# Patient Record
Sex: Female | Born: 1945 | Race: White | Hispanic: No | Smoking: Former smoker
Health system: Southern US, Community
[De-identification: ages and names within clinical notes are randomized; demographics above are authoritative.]

## PROBLEM LIST (undated history)

## (undated) DIAGNOSIS — D649 Anemia, unspecified: Secondary | ICD-10-CM

## (undated) DIAGNOSIS — R112 Nausea with vomiting, unspecified: Secondary | ICD-10-CM

## (undated) DIAGNOSIS — IMO0001 Reserved for inherently not codable concepts without codable children: Secondary | ICD-10-CM

## (undated) DIAGNOSIS — Z9889 Other specified postprocedural states: Secondary | ICD-10-CM

## (undated) DIAGNOSIS — Z87891 Personal history of nicotine dependence: Secondary | ICD-10-CM

## (undated) DIAGNOSIS — Z87828 Personal history of other (healed) physical injury and trauma: Secondary | ICD-10-CM

## (undated) DIAGNOSIS — K5900 Constipation, unspecified: Secondary | ICD-10-CM

## (undated) DIAGNOSIS — I10 Essential (primary) hypertension: Secondary | ICD-10-CM

## (undated) DIAGNOSIS — B251 Cytomegaloviral hepatitis: Secondary | ICD-10-CM

## (undated) DIAGNOSIS — Z86718 Personal history of other venous thrombosis and embolism: Secondary | ICD-10-CM

## (undated) HISTORY — PX: APPENDECTOMY: SHX54

## (undated) HISTORY — DX: Morbid (severe) obesity due to excess calories: E66.01

## (undated) HISTORY — DX: Personal history of nicotine dependence: Z87.891

## (undated) HISTORY — DX: Essential (primary) hypertension: I10

## (undated) HISTORY — PX: TONSILLECTOMY: SUR1361

## (undated) HISTORY — PX: CHOLECYSTECTOMY: SHX55

## (undated) HISTORY — PX: MOUTH SURGERY: SHX715

---

## 1997-11-15 ENCOUNTER — Ambulatory Visit (HOSPITAL_COMMUNITY): Admission: RE | Admit: 1997-11-15 | Discharge: 1997-11-15 | Payer: Self-pay | Admitting: Obstetrics and Gynecology

## 1998-12-25 ENCOUNTER — Encounter: Payer: Self-pay | Admitting: Family Medicine

## 1998-12-25 ENCOUNTER — Encounter: Admission: RE | Admit: 1998-12-25 | Discharge: 1998-12-25 | Payer: Self-pay | Admitting: Family Medicine

## 1999-01-04 ENCOUNTER — Encounter: Payer: Self-pay | Admitting: Family Medicine

## 1999-01-04 ENCOUNTER — Encounter: Admission: RE | Admit: 1999-01-04 | Discharge: 1999-01-04 | Payer: Self-pay | Admitting: Family Medicine

## 2000-03-13 ENCOUNTER — Encounter: Admission: RE | Admit: 2000-03-13 | Discharge: 2000-03-13 | Payer: Self-pay | Admitting: Obstetrics and Gynecology

## 2000-03-13 ENCOUNTER — Encounter: Payer: Self-pay | Admitting: Obstetrics and Gynecology

## 2001-07-21 ENCOUNTER — Encounter: Admission: RE | Admit: 2001-07-21 | Discharge: 2001-07-21 | Payer: Self-pay | Admitting: Internal Medicine

## 2001-07-21 ENCOUNTER — Encounter: Payer: Self-pay | Admitting: Internal Medicine

## 2001-09-23 ENCOUNTER — Ambulatory Visit (HOSPITAL_COMMUNITY): Admission: RE | Admit: 2001-09-23 | Discharge: 2001-09-23 | Payer: Self-pay | Admitting: Gastroenterology

## 2002-03-03 DIAGNOSIS — Z86718 Personal history of other venous thrombosis and embolism: Secondary | ICD-10-CM

## 2002-03-03 HISTORY — DX: Personal history of other venous thrombosis and embolism: Z86.718

## 2002-08-03 ENCOUNTER — Encounter: Payer: Self-pay | Admitting: Urology

## 2002-08-03 ENCOUNTER — Inpatient Hospital Stay (HOSPITAL_COMMUNITY): Admission: EM | Admit: 2002-08-03 | Discharge: 2002-08-05 | Payer: Self-pay | Admitting: Emergency Medicine

## 2002-09-07 ENCOUNTER — Encounter: Payer: Self-pay | Admitting: Internal Medicine

## 2002-09-07 ENCOUNTER — Inpatient Hospital Stay (HOSPITAL_COMMUNITY): Admission: AD | Admit: 2002-09-07 | Discharge: 2002-09-12 | Payer: Self-pay | Admitting: Internal Medicine

## 2002-09-08 ENCOUNTER — Encounter (INDEPENDENT_AMBULATORY_CARE_PROVIDER_SITE_OTHER): Payer: Self-pay | Admitting: Internal Medicine

## 2004-04-15 ENCOUNTER — Other Ambulatory Visit: Admission: RE | Admit: 2004-04-15 | Discharge: 2004-04-15 | Payer: Self-pay | Admitting: Obstetrics and Gynecology

## 2004-05-06 ENCOUNTER — Encounter: Admission: RE | Admit: 2004-05-06 | Discharge: 2004-05-06 | Payer: Self-pay | Admitting: Obstetrics and Gynecology

## 2005-05-19 ENCOUNTER — Encounter: Admission: RE | Admit: 2005-05-19 | Discharge: 2005-05-19 | Payer: Self-pay | Admitting: Family Medicine

## 2005-08-11 ENCOUNTER — Ambulatory Visit: Payer: Self-pay | Admitting: Family Medicine

## 2005-09-29 ENCOUNTER — Ambulatory Visit: Payer: Self-pay | Admitting: Family Medicine

## 2005-12-30 ENCOUNTER — Ambulatory Visit: Payer: Self-pay | Admitting: Family Medicine

## 2005-12-31 ENCOUNTER — Encounter: Admission: RE | Admit: 2005-12-31 | Discharge: 2005-12-31 | Payer: Self-pay | Admitting: Family Medicine

## 2006-04-07 ENCOUNTER — Ambulatory Visit: Payer: Self-pay | Admitting: Family Medicine

## 2006-04-09 ENCOUNTER — Encounter: Admission: RE | Admit: 2006-04-09 | Discharge: 2006-04-09 | Payer: Self-pay | Admitting: Family Medicine

## 2006-07-23 ENCOUNTER — Other Ambulatory Visit: Admission: RE | Admit: 2006-07-23 | Discharge: 2006-07-23 | Payer: Self-pay | Admitting: Obstetrics and Gynecology

## 2006-09-17 ENCOUNTER — Inpatient Hospital Stay (HOSPITAL_COMMUNITY): Admission: RE | Admit: 2006-09-17 | Discharge: 2006-09-20 | Payer: Self-pay | Admitting: Obstetrics and Gynecology

## 2006-09-17 ENCOUNTER — Encounter (INDEPENDENT_AMBULATORY_CARE_PROVIDER_SITE_OTHER): Payer: Self-pay | Admitting: Obstetrics and Gynecology

## 2006-09-17 HISTORY — PX: ABDOMINAL HYSTERECTOMY: SHX81

## 2006-09-24 ENCOUNTER — Inpatient Hospital Stay (HOSPITAL_COMMUNITY): Admission: AD | Admit: 2006-09-24 | Discharge: 2006-09-26 | Payer: Self-pay | Admitting: Obstetrics and Gynecology

## 2006-09-24 ENCOUNTER — Encounter: Admission: RE | Admit: 2006-09-24 | Discharge: 2006-09-24 | Payer: Self-pay | Admitting: Obstetrics and Gynecology

## 2006-11-09 ENCOUNTER — Ambulatory Visit: Payer: Self-pay | Admitting: Family Medicine

## 2006-12-11 ENCOUNTER — Ambulatory Visit: Payer: Self-pay | Admitting: Family Medicine

## 2006-12-31 ENCOUNTER — Ambulatory Visit: Payer: Self-pay | Admitting: Family Medicine

## 2007-03-08 ENCOUNTER — Ambulatory Visit: Payer: Self-pay | Admitting: Family Medicine

## 2007-06-30 ENCOUNTER — Encounter: Admission: RE | Admit: 2007-06-30 | Discharge: 2007-06-30 | Payer: Self-pay | Admitting: Family Medicine

## 2007-09-10 ENCOUNTER — Ambulatory Visit: Payer: Self-pay | Admitting: Family Medicine

## 2008-04-22 ENCOUNTER — Emergency Department (HOSPITAL_COMMUNITY): Admission: EM | Admit: 2008-04-22 | Discharge: 2008-04-23 | Payer: Self-pay | Admitting: Emergency Medicine

## 2008-06-07 ENCOUNTER — Ambulatory Visit: Payer: Self-pay | Admitting: Family Medicine

## 2008-06-30 ENCOUNTER — Encounter: Admission: RE | Admit: 2008-06-30 | Discharge: 2008-06-30 | Payer: Self-pay | Admitting: Family Medicine

## 2008-09-12 ENCOUNTER — Ambulatory Visit: Payer: Self-pay | Admitting: Family Medicine

## 2008-09-22 ENCOUNTER — Encounter: Admission: RE | Admit: 2008-09-22 | Discharge: 2008-09-22 | Payer: Self-pay | Admitting: Family Medicine

## 2009-02-15 ENCOUNTER — Ambulatory Visit: Payer: Self-pay | Admitting: Family Medicine

## 2009-03-05 ENCOUNTER — Ambulatory Visit: Payer: Self-pay | Admitting: Family Medicine

## 2009-04-05 ENCOUNTER — Ambulatory Visit: Payer: Self-pay | Admitting: Family Medicine

## 2009-07-03 ENCOUNTER — Encounter: Admission: RE | Admit: 2009-07-03 | Discharge: 2009-07-03 | Payer: Self-pay | Admitting: Family Medicine

## 2010-01-01 ENCOUNTER — Ambulatory Visit: Payer: Self-pay | Admitting: Family Medicine

## 2010-03-07 ENCOUNTER — Ambulatory Visit
Admission: RE | Admit: 2010-03-07 | Discharge: 2010-03-07 | Payer: Self-pay | Source: Home / Self Care | Attending: Family Medicine | Admitting: Family Medicine

## 2010-03-19 ENCOUNTER — Ambulatory Visit
Admission: RE | Admit: 2010-03-19 | Discharge: 2010-03-19 | Payer: Self-pay | Source: Home / Self Care | Attending: Family Medicine | Admitting: Family Medicine

## 2010-06-10 ENCOUNTER — Other Ambulatory Visit: Payer: Self-pay | Admitting: Family Medicine

## 2010-06-10 DIAGNOSIS — Z1231 Encounter for screening mammogram for malignant neoplasm of breast: Secondary | ICD-10-CM

## 2010-07-05 ENCOUNTER — Ambulatory Visit
Admission: RE | Admit: 2010-07-05 | Discharge: 2010-07-05 | Disposition: A | Discharge status: Home / Self Care | Payer: BC Managed Care – PPO | Source: Ambulatory Visit | Attending: Family Medicine | Admitting: Family Medicine

## 2010-07-05 DIAGNOSIS — Z1231 Encounter for screening mammogram for malignant neoplasm of breast: Secondary | ICD-10-CM

## 2010-07-16 NOTE — H&P (Signed)
Marcia Paul, DURIO              ACCOUNT NO.:  [id number]   MEDICAL RECORD NO.:  [id number]          PATIENT TYPE:  INP   LOCATION:  9302                          FACILITY:  [facility]   PHYSICIAN:  Charles A. Delcambre, MDDATE OF BIRTH:  07-08-1945   DATE OF ADMISSION:  09/24/2006  DATE OF DISCHARGE:                              HISTORY & PHYSICAL   A 65 year old, para 1-0-0-2.  She is to be readmitted today with history  pertinent that she had a TAH-BSO last Thursday.  She is post-op day #7  today.  She has a history of a DVT, is on Lovenox and Coumadin waiting  for the PT INR to go out.  She did well Sunday night, ate solid food.  Early in the day on Monday, she ate solid food, then started to get  nauseated Monday night.  She became significantly nauseated Tuesday and  then very nauseated Wednesday, to the point that she could not take any  pain medicine.  She has some nasal drainage that she thinks is gagging  her in her throat but is clear to white and she tries to cough that up  and then will vomit as well.  She has continued to have bowel function,  solid stool earlier in the week with a suppository, then a loose stool,  then a looser stool, then today she has yellow liquid but she continues  passing this.  She had a flat and upright today which was normal.   PAST MEDICAL HISTORY:  1. Paratubal cyst.  2. PID, age 64.  3. Hepatitis C and V.  4. DVT was noted when she was in the hospital for a month with CMV.      Evidently, negative hypercoagulation workup done at that time.  5. Diabetes mellitus.  6. Hypertension.   MEDICATIONS:  Omega-3, vitamin C, magnesium, Janumet dose not specified,  Lipitor dose not specified, Benicar/hydrochlorothiazide dose not  specified.   ALLERGIES:  She has a reaction (itching, redness, and rash) to:  1. TAPE.  2. DARVON.   Staples have not been removed to this point.   SOCIAL HISTORY:  Social alcohol.  Discontinued cigarettes 2000.  Denies  tobacco, ethanol use, drug use, STD exposure in the past except for PID  at 24.  The patient is married in a monogamous relationship with her  husband.   FAMILY HISTORY:  Heart disease in her father.  COPD in her mother.  She  denies a family history of breast, uterus, ovaries, cervical, or colon  cancer, lymphoma, stroke, diabetes, hypertension.   REVIEW OF SYSTEMS:  Denies fever or chills, rashes, lesions, headaches,  dizziness.  She does have seasonal allergies.  No chest pain, shortness  of breath, wheezing.  She does have diarrhea at this time.  No  constipation, no bleeding per rectum, no melena.  She does not have  urgency, frequency, dysuria, incontinence, hematuria, galactorrhea, or  emotional changes.   PHYSICAL EXAMINATION:  GENERAL:  Alert and oriented x3, not feeling well  at all, has not taken any pain medicine in several days.  HEENT:  Grossly within  normal limits.  NECK:  Supple without thyromegaly or adenopathy.  BREASTS:  No masses, tenderness, discharge, skin, or nipple changes  bilaterally.  HEART:  Regular rate and rhythm without murmur, rub, or gallop.  ABDOMEN:  Soft, nontender, nondistended.  No hepatosplenomegaly or  masses noted.  No hernia.  Gross obesity is not noted limited  examination.  Staples are intact.  Incision is without erythema or  drainage.  PELVIC:  Deferred.  EXTREMITIES:  Nontender.  RECTAL:  Not done.  Anus perineal body normal.   LABORATORY:  Flat and upright are normal.  Glucose is 142, she is  fasting.  BUN 7, creatinine 0.7, sodium 136, potassium 3.5, chloride  102, CO2 25, calcium 9.5, total protein 6.8.  Albumin 3.4, total bili  0.6, alk phos 172, SGOT 91, SGPT 81.  Hemoglobin 13.2, hematocrit 39,  white blood cell count 9.3, 79 neutrophils, 13.9 lymphocytes, platelets  338.  Pro time 40.2, INR 8.94.   ASSESSMENT:  1. Resolving ileus.  2. Lack of pain medicine.  3. Intolerance of pain.  4. Nasal drainage compounding nausea and  vomiting.   PLAN:  1. Admit.  2. Hold Coumadin today.  Move to switch to a lower dose probably 5 mg      per day tomorrow and Saturday and then 7.5 mg Sunday and      thereafter.  3. We will repeat the PT INR in the morning.  4. IV lactated ringers 500 mL over 2 hours, then 125 cc/hr.  5. NPO except ice chips.  If she feels better she can have sips of      clears in the morning.  6. Dilaudid 3 mg load, 1-2 mg IV q.3 hours as noted, and the load is      IV as well, and the q.3 hours is p.r.n.  7. Reglan 10 mg q.8 h. p.r.n.  8. Zofran 4 mg IV q.4 h. p.r.n.  9. Phenergan 50 mg IV q.6 h. p.r.n.  10.Draw a hepatitis panel upon admission.  11.Stop Lovenox.      Charles A. Sydnee Cabal, MD  Electronically Signed     CAD/MEDQ  D:  09/24/2006  T:  09/24/2006  Job:  [id number]

## 2010-07-16 NOTE — Op Note (Signed)
Marcia Paul, Marcia Paul              ACCOUNT NO.:  [id number]   MEDICAL RECORD NO.:  [id number]          PATIENT TYPE:  INP   LOCATION:  9399                          [facility]ACILITY:  [facility]   PHYSICIAN:  Charles A. Delcambre, MDDATE OF BIRTH:  [birth year]   DATE OF PROCEDURE:  09/17/2006  DATE OF DISCHARGE:                               OPERATIVE REPORT   PREOPERATIVE DIAGNOSES:  1. Right ovarian mass enlarging.  2. History of a deep venous thrombosis.  3. Hypertension.  4. Diabetes.  5. Morbid obesity.   POSTOPERATIVE DIAGNOSES:  1. Benign right ovary cyst and ovary by frozen section.  2. History of a deep venous thrombosis.  3. Hypertension.  4. Diabetes.  5. Morbid obesity.   PROCEDURE:  Transabdominal hysterectomy and bilateral salpingo-  oophorectomy.   SURGEON:  Charles A. Sydnee Cabal, MD.   ASSISTANT:  Gerald Leitz, MD.   COMPLICATIONS:  None.   ESTIMATED BLOOD LOSS:  300 ml.   ANESTHESIA:  General.   SPECIMENS:  Uterus, tubes, and ovaries to Pathology.  Frozen section on  the right ovary was done with findings as noted above.   Instrument, sponge, and needle count correct x2.   DESCRIPTION OF PROCEDURE:  The patient was taken to the operating room,  placed in the supine position, and general anesthetic was induced  without difficulty.  She was then sterilely prepped and draped.  A  vertical skin incision was made with a knife, carried down to fascia  with the knife and with the Bovie cautery.  The fascia was entered  superiorly and cephalad, and the fascial incision was carried down  carefully to not enter the pannus to the symphysis pubis.  An extension  of the incision had to be made during the case to gain adequate exposure  around the umbilicus.  She did have subcu heparin for prophylaxis about  one hour before the case, and there was no excess bleeding during the  case.  Difficulty encountered was her morbid obesity, and in order to  see adequately, upper arm was  used on a Balfour retractor, the lower  bladder blade was used with a malleable arm, and ten moistened laps were  used to pack the bowel out of the pelvis totally.  Adequate  visualization was encountered at that time.  The cornual regions were  grasped with [facility] clamps.  Mild adhesions, but solid, were dissected  away from the ovary, freeing the ovary up off the bowel posteriorly and  down to the sidewall.  Blunt dissection was used to further free up the  ovary.  The cyst was not ruptured.  The round ligaments were transected  bilaterally and held with transfixion #0 Vicryl sutures.  The broad  ligament was opened bilaterally and the infundibulopelvic pedicle was  skeletonized.  The infundibulopelvic pedicles were then taken on either  side successively, free tied, and then transfixion stitched with #0  Vicryl.  The vesicouterine peritoneum was taken down across the anterior  surface of the lower uterine segment sharply with the Metzenbaum  scissors and then bluntly with the sponge forceps.  This allowed  visualization of the uterine vessels and after skeletonization these  vessels were taken in simple stitches bilaterally, successful bites of  the cardinal ligaments, and finally including the vaginal angle were  taken down on either side and all were transfixion stitched with #0  Vicryl.  The cervix was removed from the vagina, and the vaginal cuff  was closed with two Richardson angle sutures of #0 Vicryl and then a #0  Vicryl running locking suture.  Irrigation was carried out, and a small  amount of bleeding was encountered where the broad ligament was  dissected on the right.  Minor electrocautery was applied and Avitene  was placed in this area after pressure was held.  No further oozing was  noted, the Avitene was placed, and prior to that irrigation had been  carried out two times.  There was no evidence of injury to the bowel or  the bladder.  With good hemostasis established,  all sutures were removed  and the instruments were removed including the laps, with the media  count being correct, and the Balfour retractor.  The fascia was then  grasped with Kocher clamps bilaterally, a #1 PDS was used to close in a  running nonlocking stitch the fascia from above and then another stitch  from below, a #0 plain gut was placed in the subcutaneous tissue,  sterile skin staples were applied, and the patient tolerated the  procedure well.  A dressing was applied, and she was taken to recovery  with the physician in attendance.      Charles A. Sydnee Cabal, MD  Electronically Signed     CAD/MEDQ  D:  09/17/2006  T:  09/17/2006  Job:  [id number]

## 2010-07-16 NOTE — H&P (Signed)
Marcia Paul, Marcia Paul              ACCOUNT NO.:  [id number]   MEDICAL RECORD NO.:  [id number]          PATIENT TYPE:  AMB   LOCATION:                                [facility]  [facility]   PHYSICIAN:  Charles A. Delcambre, MDDATE OF BIRTH:  [birth year]   DATE OF ADMISSION:  09/17/2006  DATE OF DISCHARGE:                              HISTORY & PHYSICAL   HISTORY OF PRESENT ILLNESS:  The patient has a multiloculated cyst on  the right and a right paratubal cyst up to 4 cm.  CA 125 was 7.8.  Over  the last several years, the multiloculated cyst has been increasing in  size now up to 2.9 cm.  This has been slow growth over these years but  nonetheless, it is increasing in size in this menopausal woman.  She has  hemoglobin A1c recently of 6.2, fasting sugars that were on Janumet in  the 105- to 120-range.  It is occasionally 120s.  I feel this is likely  a benign cyst, but in the light of it being enlarged up to 4 cm on the  right with a multiloculated cyst as well adjacent and possibly together,  I would recommend surgery.  She gives informed consent, accepts the  risks of infection, bleeding, bowel and bladder damage, blood product  risk, hepatitis and HIV exposure, ureteral damage.  She understands that  she is at risk for a DVT in that she was hospitalized for CMV hepatitis  for one month and did have a DVT.  She was on Coumadin for six months  and presumably she states she had a hypercoagulable workup done at that  time.  This has been several years ago.   PAST MEDICAL HISTORY:  1. Hypertension.  2. Diabetes.  3. History of DVT after prolonged hospitalization for hepatitis.      Coumadin for 6 months.   SURGICAL HISTORY:  1. Tubal ligation.  2. D&C, hysteroscopy benign.   MEDICATIONS:  1. Janumet, dose not specified.  2. Lipitor, dose not specified.  3. Benicar/hydrochlorothiazide, dose not specified.   ALLERGIES:  NO KNOWN DRUG ALLERGIES.   SOCIAL HISTORY:  No tobacco, ethanol  or drug use.  She does have some  social alcohol use.  She did start smoking once again after seven years.  She smokes about a half pack per day at this time.  She is married in  aand in a monogamous relationship with her husband.   FAMILY HISTORY:  Heart disease in her father, COPD in her mother and  otherwise no major illnesses.  No ovarian cancer.   REVIEW OF SYSTEMS:  Denies fevers, chills, rashes, lesions, headaches,  dizziness, seasonal allergies, chest pain, shortness of breath,  wheezing, chronic cough, diarrhea, constipation, bleeding, melena,  hematochezia, urgency, frequency, dysuria, incontinence, hematuria,  galactorrhea or emotional changes.   PHYSICAL EXAMINATION:  GENERAL:  Alert and oriented x3.  VITAL SIGNS:  Blood pressure 124/80, heart rate 88, respirations 24,  weight, which is significant, 380 pounds.  HEENT:  Grossly within normal limits.  NECK:  Supple without thyromegaly or adenopathy.  LUNGS:  Clear bilaterally.  BACK:  No CVA, vertebral bodies nontender to palpation.  BREASTS:  No masses or tenderness, discharge, skin or nipple changes  bilaterally.  ABDOMEN:  Grossly obese.  GENITALIA:  Normal external female genitalia.  Bartholin's, urethral,  Skene's within normal limits.  Vault without discharge or lesions.  Multiparous cervix.  No cervical motion tenderness.  Uterus mildly  enlarged.  I could detect fullness in the right adnexa, mildly tender.  Left adnexa nontender.  I could not palpate the ovary.  EXTREMITIES:  Markedly obese in the thighs and calves.  Not edematous.   ASSESSMENT:  1. Enlarging pelvic mass, ovary on right side with multiloculated cyst      with normal CA 125 and a large paratubal cyst.  2. Hypertension.  3. Diabetes, well controlled with Janumet.  4. History of hepatitis C with a DVT (deep venous thrombosis).   PLAN:  Transabdominal hysterectomy and bilateral salpingo-oophorectomy.  Postoperatively, we will begin Lovenox the  evening of the surgery and  continue Lovenox until Coumadin brings the PT with INR 1.5 to 2.5 or  even 2 to 3 range.  She was in agreement and will follow up as directed.  Surgery is scheduled for September 17, 2006.  SCDs will be used around the  surgical time and she will remain n.p.o. past midnight.  She will take  her hypertension medicines and the Benicar/hydrochlorothiazide in the  morning.  Janumet she will withhold.  Lipitor she should take in the  evening.      Charles A. Sydnee Cabal, MD  Electronically Signed     CAD/MEDQ  D:  09/09/2006  T:  09/10/2006  Job:  [id number]

## 2010-07-16 NOTE — H&P (Signed)
Marcia Paul, Marcia Paul              ACCOUNT NO.:  [id number]   MEDICAL RECORD NO.:  [id number]          PATIENT TYPE:  AMB   LOCATION:  SDC                           [facility]   PHYSICIAN:  Charles A. Delcambre, MDDATE OF BIRTH:  Jan 06, 1946   DATE OF ADMISSION:  DATE OF DISCHARGE:                              HISTORY & PHYSICAL   Surgery scheduled for September 17, 2006.  She has an enlarging ovarian cyst  or mass, complex cyst, and CA-125 has been normal at 7.8, but as it  continues to enlarge we are going to go ahead and surgically get active  on this patient.  She will be admitted for transabdominal hysterectomy,  bilaterally salpingo-oophorectomy.  All questions are answered and she  accepts risks of infection, bleeding, bowel and bladder damage, blood  product risks including hepatitis and HIV exposure, remote risk of  cancer, ureteral damage, and incisional infection, DVT.  She is a 65-  year-old para 1-0-0-1.   PAST MEDICAL HISTORY:  1. Paratubal cyst.  2. PID age 7.  3. Hepatitis CMV.  4. DVT was noted when she was in the hospital for a month for CMV.      Evidently negative hypercoagulable workup done at that time.  5. Diabetes mellitus.  6. Hypertension.   MEDICATIONS:  Omega 3, vitamin C, magnesium, Janumet - dose not  specified, Lipitor - dose not specified, Benicar/hydrochlorothiazide -  dose not specified.   ALLERGIES:  She has a reaction to TAPE and DARVON - itching and redness  and rash.   SOCIAL HISTORY:  Social alcohol.  Discontinued cigarettes 2000.  Denies  tobacco or ethanol abuse, drug use or STD exposure in the past.  The  patient is married and in a monogamous relationship with her husband.   FAMILY HISTORY:  Heart disease in her father, COPD in her mother.  She  denies family history of breast, uterus, ovary, cervix or colon cancer,  lymphoma, stroke, diabetes or hypertension.   REVIEW OF SYSTEMS:  Denies fever, chills, rashes, lesions,  headaches,  dizziness, seasonal allergies, chest pain, shortness of breath,  wheezing, diarrhea, constipation, bleeding, melena, hematochezia,  urgency, frequency, dysuria, incontinence, hematuria, galactorrhea, or  emotional changes.   PHYSICAL EXAMINATION:  Alert and oriented x3.  No distress.  HEENT:  Grossly within normal limits.  NECK:  Supple without thyromegaly or adenopathy.  BREASTS:  No masses, tenderness, discharge, skin or nipple changes  bilaterally.  HEART:  Regular rate and rhythm without murmur, rub or gallop.  ABDOMEN:  Soft, nontender, nondistended.  No hepatosplenomegaly or other  masses noted.  No hernia.  Gross obesity is noted, limiting examination.  PELVIC:  Normal external female genitalia.  Bartholin's/urethral/Skene's  within normal limits.  Urethral meatus normal.  Urethra normal, bladder  normal, vagina normal.  Nulliparous-appearing cervix and menopausal in  appearance.  Vault without discharge or lesions.  No cervical motion  tenderness is present.  Pap was done which returned last Jul 23, 2006,  negative.  Bimanual examination is precluded secondary to gross obesity.  Exam difficult and nontender.  Uterus is not directly  palpable.  Adnexa  reached and nontender without masses bilaterally.  Limited by exam.  RECTAL:  Exam is not done secondary to discomfort with exam.  External  hemorrhoids are not present.  Anus and perineal body appear normal.   ASSESSMENT:  Pelvic mass, last on ultrasound seen 2.9 x 1.2 x 2.0 cm,  multiloculated, slowly increasing in size.  Right simple paratubal cyst  4.0 x 3.5 x 3.3 cm, increased in size from previous examination.  I  would recommend surgical exploration at this time secondary to the  multiloculated enlarging nature of the cyst on the ovary.  She gives  informed consent, with negative CA-125.  Will go ahead and proceed.  All  questions were answered.  She gives consent.  Accepts risks of  infection, bleeding, bowel  and bladder damage, blood product risks  including hepatitis and HIV exposure, ureteral damage, DVT risk and  incisional infection.  All questions were answered and will proceed as  outlined.  Postoperatively, we will go ahead and initiate Lovenox and  give Lovenox 6 hours after surgery 40 mg b.i.d., and then will go on and  get her onto the Coumadin starting postoperative day #1, and will  continue this for 3 months postoperatively.  SCDs will be used on the  thigh and legs.  Main limitation is the patient's body habitus and size  at 390, and will get a anesthesia preoperative.  Preoperative labs will  be CBC, PT, PTT, INR, type and screen, CMET, hepatic functional panel,  hemoglobin A1c, and EKG and chest x-ray.  All questions were answered  and we will proceed as outlined.      Charles A. Sydnee Cabal, MD  Electronically Signed     CAD/MEDQ  D:  09/10/2006  T:  09/11/2006  Job:  [id number]

## 2010-07-19 NOTE — Discharge Summary (Signed)
NAME:  Marcia Paul, Marcia Paul                        ACCOUNT NO.:  [id number]   MEDICAL RECORD NO.:  [id number]                   PATIENT TYPE:  INP   LOCATION:  6740                                 FACILITY:  [facility]   PHYSICIAN:  Thora Lance, M.D.               DATE OF BIRTH:  Sep 02, 1945   DATE OF ADMISSION:  09/07/2002  DATE OF DISCHARGE:  09/12/2002                                 DISCHARGE SUMMARY   REASON FOR ADMISSION:  This 65 year old white female was admitted to [facility] secondary to generalized weakness, myalgia, fever, headache,  rash, and some elevation of LFT's.  She was treated symptomatically and  discharged after two days.  CMV IgM serology came back elevated/positive.  Monostat was negative.  She had some atypical lymphocytes, and her blood  smear was felt to have CMV related mononucleosis syndrome with hepatitis.  Over the ensuing month, the patient admits significant improvement.  She had  persistent nausea, bilious vomiting, low-grade fever with generalized  weakness.  Her liver function tests had trended downward, but had not  normalized.  The patient was eating very little and taking only fluid.  An  acute hepatitis viral panel had been done and was negative.  Her right upper  quadrant ultrasound showed hepatomegaly and mild splenomegaly with diffuse  fatty changes throughout the liver.  HIV ELISA was reactive, but her Western  Blot test was indeterminate.   PHYSICAL EXAMINATION:  VITAL SIGNS:  Blood pressure 122/72, heart rate 80,  temperature 97.3.  ABDOMEN:  Soft.  There is mild tenderness in the upper quadrant.  Abdomen  without mass.  Hepatosplenomegaly is difficult to assess because of obesity.   ADMISSION LABORATORY DATA:  CBC:  WBC 6.7, hemoglobin 14.4, platelet count  278.  ESR was 19.  PT was 16.2, PTT 53.  Chemistries:  Sodium 133, potassium  3.1, chloride 98, bicarbonate 24, glucose 127, BUN 13, creatinine 1, calcium  9.9, total protein  7.1, albumin 3.6.  AST 123, ALT 64, alkaline phosphatase  100, total bilirubin 1.7, magnesium 1.7.  TSH 1.198.  Prealbumin 25.4.   HOSPITAL COURSE:  1. 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  The patient's liver function tests steadily improved.     By September 09, 2002, there was a marked improvement in the patient's clinical     status with less nausea.  She began eating some solid foods.  The patient     was seen by the infectious disease service.  They felt that the HIV     serology was likely a false-positive.  This was eventually confirmed with     CMV viral load testing proving negative.  The patient also had an     elevated PT and PTT.  She had a lupus anticoagulant test performed which     was negative.  Anticardiolipin antibodies, IgA, IgG, and IgM were all     negative.  On September 12, 2002, the patient had a venous ultrasound done as     she developed some left leg swelling, and this did show a calf DVT.  The     patient therefore was discharged on Lovenox and Coumadin.  The final     infectious disease impressions were CMV infection, clinically improved,     and false-positive HIV serology.   DISCHARGE DIAGNOSES:  1. Cytomegalovirus mononucleosis/hepatitis.  2. Left calf deep vein thrombosis.  3. Diabetes mellitus.  4. Hypertension.   DISCHARGE MEDICATIONS:  1. Prilosec OTC 20 mg daily.  2. Mucinex 600 mg one b.i.d.  3. Allegra p.r.n.  4. Nasonex p.r.n.  5. Lovenox 150 mg subcutaneously q.12h.  6. Coumadin 5 mg 1-1/2 q.p.m. x2 nights.   ACTIVITY:  As tolerated.   DIET:  Diabetic diet.   FOLLOWUP:  1. PT to be checked on September 14, 2002, at 8 a.m.  2. Follow up in 10 to 14 days with Dr. Kirby Funk.                                                Thora Lance, M.D.    Delorse Limber  D:  10/09/2002  T:  10/09/2002  Job:  [id number]

## 2010-07-19 NOTE — Discharge Summary (Signed)
Marcia Paul, Marcia Paul              ACCOUNT NO.:  [id number]   MEDICAL RECORD NO.:  [id number]          PATIENT TYPE:  INP   LOCATION:  9302                          FACILITY:  [facility]   PHYSICIAN:  Charles A. Delcambre, MDDATE OF BIRTH:  [birth year]   DATE OF ADMISSION:  09/24/2006  DATE OF DISCHARGE:  09/26/2006                               DISCHARGE SUMMARY   PRIMARY DISCHARGE DIAGNOSES:  1. Benign right ovarian cyst.  2. History of deep vein thrombosis.  3. Hypertension.  4. Diabetes.  5. Morbid obesity.  6. Postoperative ileus.   PROCEDURE:  Transabdominal hysterectomy and bilateral salpingo-  oophorectomy, management of anticoagulation.   HISTORY AND PHYSICAL:  Dictated and on the chart.   DISPOSITION:  The patient was discharged home to follow up in the office  in one week.  She was given precautions for no lifting greater than 25  pounds.  No driving a car for 2 weeks.  No long bath, shower okay for 2  weeks.  No intercourse for 4 weeks.  Notify with temperature greater  than 100 degrees, increased pain, or vaginal bleeding, or incisional  drainage or erythema.   She was given prescriptions for:  1. Lovenox 40 mg one subcutaneous b.i.d.  2. Coumadin 5 mg one p.o. daily.  Recheck PT INR in 3 days at the      office.  3. She was also given a prescription for Percocet 1-2 p.o. q.4 h.      p.r.n.  4. Instructed to restart the Benicar and Janumet in the morning, after      discharge.   CYTOLOGY:  Negative.   PATHOLOGY:  Cervix:  Unremarkable.  Endometrium:  Inactive.  Myometrium:  Adenomyosis and leiomyoma incidental.  Serosa:  Unremarkable.  Right  adnexa:  Serous cystadenofibroma with focal calcification benign.  Hydrosalpinx without evidence of malignancy and likely status post tubal  ligation.   HOSPITAL COURSE:  The patient was admitted, underwent Coumadin  regulation.  She was put NPO and bowel function slowly returned.  She  was advanced on a diet and discharged  home on September 26, 2006 with  followup as noted above.      Charles A. Sydnee Cabal, MD  Electronically Signed     CAD/MEDQ  D:  10/16/2006  T:  10/16/2006  Job:  [id number]

## 2010-07-19 NOTE — H&P (Signed)
NAME:  Marcia Paul, Marcia Paul                        ACCOUNT NO.:  [id number]   MEDICAL RECORD NO.:  [id number]                   PATIENT TYPE:  INP   LOCATION:  1829                                 FACILITY:  [facility]   PHYSICIAN:  Thora Lance, M.D.               DATE OF BIRTH:  1945-09-16   DATE OF ADMISSION:  08/03/2002  DATE OF DISCHARGE:                                HISTORY & PHYSICAL   CHIEF COMPLAINT:  Headache, weakness.   HISTORY OF PRESENT ILLNESS:  Liseth Wann is a 65 year old white female  patient of Dr. Kirby Funk who presents with a one-week history of periodic  weakness, body aches, headache, fever.  Husband states that she has not  checked her temperature at home but she has had chills and sweats,  especially in the last few days.  She has had some nausea but no vomiting  and has found it difficult to take in any p.o.  She has taken in some sips  of fluids in the last 24 hours.  In the last few days, she has been feeling  dizzy, like she was getting ready to faint.  She had a rash on her hands  last week that spread to her abdomen.  The hand rash had resolved but she  continues to have some areas on her abdomen.  In the office, she was found  to be extremely weak and hypotensive, as well as complaining of nausea.  She  is being admitted for the same.   REVIEW OF SYSTEMS:  NECK:  No neck stiffness.  CHEST:  No dyspnea, no cough,  no wheezing.  GI:  No pain but has had definite nausea without vomiting, as  above.  GU:  Denies any dysuria, hematuria.  No frequency.  PERIPHERAL:  No  edema.  INTEGUMENT:  A rash on hands last week spread to abdomen, now  resolving.   PAST MEDICAL HISTORY:  1. Hypertension.  2. Diet-controlled diabetes mellitus.  3. Allergic rhinitis.  4. Morbid obesity.  5. Depression.  6. Left ovarian cyst, followed by Dr. Floyde Parkins.   PAST SURGICAL HISTORY:  1. Appendectomy.  2. Tonsillectomy.  3. Cholecystectomy.  4. Tubal  ligation.  5. Dilatation and curettage.   ALLERGIES:  No true drug allergies.  She is intolerant to [facility] and an ACE  INHIBITOR.   SOCIAL HISTORY:  She is married, has one daughter age 41 and a grandchild  age 47.  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  She  has a graduate degree in accounting and business management.   FAMILY HISTORY:  Father died at age 57 of an MI.  Mother died at age 55s of  COPD.  She also had breast cancer.  She had a half-brother who died at age  69 from an MI.   MEDICATIONS:  1. Nettle leaf.  2. Diovan 40 mg a  day.  3. Omega-3 three-thousand mg a day.  4. __________  1200 mg a day.  5. Magnesium 750 mg a day.   PHYSICAL EXAMINATION:  VITAL SIGNS:  Her temperature is 99.2, pulse 78,  respirations 20.  Blood pressure lying 100/70, sitting 78 systolic.  GENERAL:  Very weak.  She is presyncopal when obtaining orthostatic blood  pressure measurements.  HEENT:  Ear, nose, and throat:  Unremarkable.  NECK:  No adenopathy.  HEART:  Regular rate and rhythm.  LUNGS:  Breath sounds are clear.  ABDOMEN:  Bowel sounds present in all four quadrants.  She is slightly  tender in the upper quadrants.  There is no obvious guarding or rebounding.  Exam is limited due to the patient's size.  GENITOURINARY:  Deferred.  EXTREMITIES:  No edema.  NEUROLOGIC:  No focal deficits seen.  INTEGUMENT:  Exam reveals a few maculopapular areas on her abdomen.  She has  no palmar rash.   IMPRESSION:  Weakness, hypotension.   PLAN:  Admit.  Obtain blood cultures x2, CBC, a CMET, RMSF, chest x-ray.  Start doxycycline and Rocephin after stat blood work obtained.                                               Thora Lance, M.D.    Delorse Limber  D:  08/03/2002  T:  08/03/2002  Job:  [id number]

## 2010-07-19 NOTE — Discharge Summary (Signed)
NAME:  Marcia Paul, Marcia Paul                        ACCOUNT NO.:  [id number]   MEDICAL RECORD NO.:  [id number]                   PATIENT TYPE:  INP   LOCATION:  5703                                 FACILITY:  [facility]   PHYSICIAN:  Thora Lance, M.D.               DATE OF BIRTH:  [birth year]   DATE OF ADMISSION:  08/03/2002  DATE OF DISCHARGE:  08/05/2002                                 DISCHARGE SUMMARY   REASON FOR ADMISSION:  A 65 year old white female who presented with one-  week history of periodic weakness, body aches, headache and  fever.  The  fever was subjective and low grade.  She had had some chills and sweats  periodically.  She has had nausea, but no vomiting.  In the days prior to  admission she had been feeling dizzy as if she was getting ready to faint.  She had a rash on her hands a week prior to admission which was resolving  and an abdominal rash which is also clearing.  In our office, she was found  to be very weak and hypotensive and was admitted for the same.   SIGNIFICANT FINDINGS:  VITAL SIGNS:  Temperature 99.2, heart rate 78,  respirations 20, blood pressure 100/70 lying and 70 sitting.  ABDOMEN:  Slightly tender in the upper quadrant.  No obvious rash.   LABORATORY DATA:  At admission, chemistries:  Sodium 134, potassium 3.4,  chloride 105, bicarbonate 24, glucose 145, BUN 12, creatinine 1.0, calcium  8.0, albumin 2.5, total protein 5.7, SGOT 228, SGPT 145, alk phos 88, total  bilirubin 0.7.  CBC on June 3:  WBC 3.7, hemoglobin 12.2, platelet count  159.   HOSPITAL COURSE:  The patient was admitted for a presumed infection with  symptoms of subjective fever, weakness, dizziness, myalgia and rash by her  report although this was never documented.  Blood cultures and urine culture  were taken and remain negative at the time of discharge.  [facility]  spotted fever titer was down and is pending.  Chest x-ray showed no  infiltrate.  Urinalysis was clear.   The patient was treated empirically with  Rocephin and doxycycline.  She was given IV fluids for dehydration.  By the  second hospital day, she was feeling much better with marked improvement in  all of her symptoms.  Her liver function tests were moderately abnormal with  an elevated SGOT and SGPT although her alk phos and total bilirubin remain  stable.  A mono spot and CVM, IgM were done and pending at discharge.  The  etiology of the elevated LFTs was felt to be probably secondary to a viral  syndrome versus possibly her hypotension.  At discharge, the patient was  taking p.o. well and ambulating.   DISCHARGE DIAGNOSES:  1. Viral syndrome.  2. Diabetes mellitus.   PROCEDURE:  None.   DISCHARGE MEDICATIONS:  1. Doxycycline 100  mg p.o. b.i.d. for five days.  2. Multivitamin.  3. Hold Diovan for now.   DIET:  No restrictions.    ACTIVITY:  As tolerated.   FOLLOW UP:  One week with Dr. Valentina Lucks.  Check liver function tests Monday in  my office.                                               Thora Lance, M.D.    Delorse Limber  D:  08/05/2002  T:  08/05/2002  Job:  [id number]
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
## 2010-07-22 ENCOUNTER — Telehealth: Payer: Self-pay | Admitting: Family Medicine

## 2010-07-22 NOTE — Telephone Encounter (Signed)
Faxed RX in chart

## 2010-08-05 ENCOUNTER — Other Ambulatory Visit: Payer: Self-pay

## 2010-08-05 MED ORDER — OLMESARTAN MEDOXOMIL-HCTZ 40-25 MG PO TABS: 1.0000 | ORAL_TABLET | Freq: Every day | ORAL | Status: DC

## 2010-11-08 ENCOUNTER — Encounter: Payer: Self-pay | Admitting: Family Medicine

## 2010-12-16 LAB — PROTIME-INR
INR: 1
INR: 1.2
INR: 5.5
Prothrombin Time: 12.8
Prothrombin Time: 53.8 — ABNORMAL HIGH

## 2010-12-16 LAB — CBC
HCT: 34.2 — ABNORMAL LOW
HCT: 43
Hemoglobin: 11.5 — ABNORMAL LOW
Hemoglobin: 14.1
MCHC: 32.9
MCHC: 33.6
MCV: 89.4
MCV: 89.8
RBC: 3.8 — ABNORMAL LOW
RDW: 14.5 — ABNORMAL HIGH

## 2010-12-16 LAB — COMPREHENSIVE METABOLIC PANEL
ALT: 21
AST: 42 — ABNORMAL HIGH
Albumin: 2.8 — ABNORMAL LOW
Alkaline Phosphatase: 83
BUN: 7
CO2: 26
CO2: 27
Chloride: 103
Chloride: 103
Creatinine, Ser: 0.73
Creatinine, Ser: 0.77
GFR calc non Af Amer: >60
GFR calc non Af Amer: >60
Glucose, Bld: 107 — ABNORMAL HIGH
Potassium: 5
Sodium: 135
Total Bilirubin: 0.6
Total Bilirubin: 1.2

## 2010-12-16 LAB — TYPE AND SCREEN
ABO/RH(D): O POS
Antibody Screen: POSITIVE
DAT, IgG: NEGATIVE

## 2010-12-16 LAB — HEPATITIS PANEL, ACUTE
Hep A IgM: NEGATIVE
Hep B C IgM: NEGATIVE

## 2010-12-16 LAB — APTT
aPTT: 30
aPTT: 62 — ABNORMAL HIGH

## 2010-12-18 ENCOUNTER — Other Ambulatory Visit: Payer: Self-pay | Admitting: Family Medicine

## 2010-12-18 MED ORDER — SITAGLIPTIN PHOS-METFORMIN HCL 50-500 MG PO TABS: 1.0000 | ORAL_TABLET | Freq: Two times a day (BID) | ORAL | Status: DC

## 2010-12-18 NOTE — Telephone Encounter (Signed)
Filled med with 1 refill gives pt enough time to make appt

## 2010-12-18 NOTE — Telephone Encounter (Signed)
Pt needs refill Janumet, will be scheduling appointment but going out of town tomorrow and needs refill

## 2011-01-22 ENCOUNTER — Ambulatory Visit (INDEPENDENT_AMBULATORY_CARE_PROVIDER_SITE_OTHER): Payer: BC Managed Care – PPO | Admitting: Family Medicine

## 2011-01-22 ENCOUNTER — Encounter: Payer: Self-pay | Admitting: Family Medicine

## 2011-01-22 DIAGNOSIS — Z0000 Encounter for general adult medical examination without abnormal findings: Secondary | ICD-10-CM

## 2011-01-22 DIAGNOSIS — E1169 Type 2 diabetes mellitus with other specified complication: Secondary | ICD-10-CM | POA: Insufficient documentation

## 2011-01-22 DIAGNOSIS — E1136 Type 2 diabetes mellitus with diabetic cataract: Secondary | ICD-10-CM | POA: Insufficient documentation

## 2011-01-22 DIAGNOSIS — Z23 Encounter for immunization: Secondary | ICD-10-CM

## 2011-01-22 DIAGNOSIS — E119 Type 2 diabetes mellitus without complications: Secondary | ICD-10-CM

## 2011-01-22 DIAGNOSIS — E785 Hyperlipidemia, unspecified: Secondary | ICD-10-CM

## 2011-01-22 DIAGNOSIS — R319 Hematuria, unspecified: Secondary | ICD-10-CM

## 2011-01-22 DIAGNOSIS — Z79899 Other long term (current) drug therapy: Secondary | ICD-10-CM

## 2011-01-22 DIAGNOSIS — Z2911 Encounter for prophylactic immunotherapy for respiratory syncytial virus (RSV): Secondary | ICD-10-CM

## 2011-01-22 DIAGNOSIS — H269 Unspecified cataract: Secondary | ICD-10-CM

## 2011-01-22 DIAGNOSIS — E118 Type 2 diabetes mellitus with unspecified complications: Secondary | ICD-10-CM | POA: Insufficient documentation

## 2011-01-22 DIAGNOSIS — N029 Recurrent and persistent hematuria with unspecified morphologic changes: Secondary | ICD-10-CM | POA: Insufficient documentation

## 2011-01-22 DIAGNOSIS — E1159 Type 2 diabetes mellitus with other circulatory complications: Secondary | ICD-10-CM | POA: Insufficient documentation

## 2011-01-22 DIAGNOSIS — F17200 Nicotine dependence, unspecified, uncomplicated: Secondary | ICD-10-CM | POA: Insufficient documentation

## 2011-01-22 DIAGNOSIS — I10 Essential (primary) hypertension: Secondary | ICD-10-CM

## 2011-01-22 LAB — CBC WITH DIFFERENTIAL/PLATELET
Basophils Absolute: 0 10*3/uL (ref 0.0–0.1)
Basophils Relative: 0 % (ref 0–1)
Eosinophils Relative: 2 % (ref 0–5)
HCT: 42.7 % (ref 36.0–46.0)
MCH: 28.4 pg (ref 26.0–34.0)
MCHC: 31.6 g/dL (ref 30.0–36.0)
MCV: 89.7 fL (ref 78.0–100.0)
Monocytes Absolute: 0.6 10*3/uL (ref 0.1–1.0)
RDW: 14.2 % (ref 11.5–15.5)

## 2011-01-22 LAB — COMPREHENSIVE METABOLIC PANEL
AST: 36 U/L (ref 0–37)
Alkaline Phosphatase: 93 U/L (ref 39–117)
BUN: 23 mg/dL (ref 6–23)
Calcium: 10.2 mg/dL (ref 8.4–10.5)
Creat: 0.87 mg/dL (ref 0.50–1.10)
Glucose, Bld: 133 mg/dL — ABNORMAL HIGH (ref 70–99)

## 2011-01-22 LAB — LIPID PANEL
HDL: 56 mg/dL (ref >39)
LDL Cholesterol: 56 mg/dL (ref 0–99)
Total CHOL/HDL Ratio: 2.3 Ratio
Triglycerides: 75 mg/dL (ref <150)

## 2011-01-22 LAB — HEMOGLOBIN A1C: Hgb A1c MFr Bld: 6.6 % — ABNORMAL HIGH (ref <5.7)

## 2011-01-22 LAB — POCT URINALYSIS DIPSTICK
Blood, UA: NEGATIVE
Glucose, UA: NEGATIVE
Ketones, UA: NEGATIVE
Spec Grav, UA: 1.015
Urobilinogen, UA: NEGATIVE

## 2011-01-22 NOTE — Progress Notes (Signed)
Subjective:    Patient ID: Marcia Paul, female    DOB: [birth year], 65 y.o.   MRN: [id number]  HPI She is here for a complete examination. She continues on medications listed in the chart. She has no particular concerns or complaints. She has been helping with various family members with her medical and social issues and apparently things are relatively calm in her life right now. She does not exercise regularly. She does continue to smoke although she has cut back. She gets an eye exam every 2 years. She does have cataracts and is being followed for this. She has a previous history of hematuria. Her work and home life are going well.   Review of Systems  Constitutional: Negative.   HENT: Negative.   Eyes: Negative.   Respiratory: Negative.   Cardiovascular: Negative.   Gastrointestinal: Negative.   Genitourinary: Negative.   Musculoskeletal: Negative.   Skin: Negative.   Neurological: Negative.   Hematological: Negative.   Psychiatric/Behavioral: Negative.        Objective:   Physical Exam BP 140/80  Pulse 74  Ht 5' 7.25" (1.708 m)  Wt 392 lb (177.81 kg)  BMI 60.94 kg/m2  General Appearance:    Alert, cooperative, no distress, appears stated age  Head:    Normocephalic, without obvious abnormality, atraumatic  Eyes:    PERRL, conjunctiva/corneas clear, EOM's intact, fundi    benign  Ears:    Normal TM's and external ear canals  Nose:   Nares normal, mucosa normal, no drainage or sinus   tenderness  Throat:   Lips, mucosa, and tongue normal; teeth and gums normal  Neck:   Supple, no lymphadenopathy;  thyroid:  no   enlargement/tenderness/nodules; no carotid   bruit or JVD  Back:    Spine nontender, no curvature, ROM normal, no CVA     tenderness  Lungs:     Clear to auscultation bilaterally without wheezes, rales or     ronchi; respirations unlabored  Chest Wall:    No tenderness or deformity   Heart:    Regular rate and rhythm, S1 and S2 normal, no murmur, rub   or  gallop  Breast Exam:    Deferred to GYN  Abdomen:     Soft, non-tender, nondistended, normoactive bowel sounds,    no masses, no hepatosplenomegaly  Genitalia:    Deferred to GYN     Extremities:   No clubbing, cyanosis , both lower extremities are quite edematous   Pulses:   2+ and symmetric all extremities  Skin:   Skin color, texture, turgor normal, no rashes or lesions  Lymph nodes:   Cervical, supraclavicular, and axillary nodes normal  Neurologic:   CNII-XII intact, normal strength, sensation and gait; reflexes 2+ and symmetric throughout          Psych:   Normal mood, affect, hygiene and grooming.           Assessment & Plan:   1. Diabetes mellitus  CBC with Differential, Comprehensive metabolic panel, Lipid panel, HgB A1c, POCT UA - Microalbumin  2. Hypertension associated with diabetes    3. Hyperlipidemia LDL goal <70  Lipid panel  4. Obesity, morbid (more than 100 lbs over ideal weight or BMI > 40)  CBC with Differential, Comprehensive metabolic panel, Lipid panel  5. Current smoker    6. Cataract    7. Benign hematuria    8. Encounter for long-term (current) use of other medications  CBC with Differential, Comprehensive metabolic  panel, Lipid panel  9. Physical exam, annual  POCT Urinalysis Dipstick   I discussed in detail making permanent lifestyle changes in regard to her diet and exercise as well as making sure she takes time to for herself. She recognizes the fact that she always puts her but he also is on, needs and desires above her own. She did seem to understand the whole concept and became quite tearful when we discuss this in more detail. I think she will indeed start to take better care of herself. Flu shot and a Zostavax shot given. Risks and benefits were discussed with her.

## 2011-02-06 ENCOUNTER — Other Ambulatory Visit: Payer: Self-pay | Admitting: Family Medicine

## 2011-02-18 ENCOUNTER — Other Ambulatory Visit: Payer: Self-pay | Admitting: Family Medicine

## 2011-02-18 MED ORDER — KETOCONAZOLE 2 % EX CREA: TOPICAL_CREAM | Freq: Every day | CUTANEOUS | Status: DC

## 2011-02-18 NOTE — Telephone Encounter (Signed)
Patient needs refill  Ketoconazole 2% cream (60grams) [facility] [address]

## 2011-02-23 ENCOUNTER — Other Ambulatory Visit: Payer: Self-pay | Admitting: Family Medicine

## 2011-03-25 ENCOUNTER — Other Ambulatory Visit: Payer: Self-pay | Admitting: Family Medicine

## 2011-04-27 ENCOUNTER — Other Ambulatory Visit: Payer: Self-pay | Admitting: Family Medicine

## 2011-05-16 ENCOUNTER — Encounter: Payer: Self-pay | Admitting: Internal Medicine

## 2011-05-22 ENCOUNTER — Ambulatory Visit (INDEPENDENT_AMBULATORY_CARE_PROVIDER_SITE_OTHER): Payer: BC Managed Care – PPO | Admitting: Family Medicine

## 2011-05-22 ENCOUNTER — Encounter: Payer: Self-pay | Admitting: Family Medicine

## 2011-05-22 VITALS — BP 140/92 | HR 68 | Ht 70.0 in | Wt 376.0 lb

## 2011-05-22 DIAGNOSIS — E785 Hyperlipidemia, unspecified: Secondary | ICD-10-CM

## 2011-05-22 DIAGNOSIS — E119 Type 2 diabetes mellitus without complications: Secondary | ICD-10-CM

## 2011-05-22 DIAGNOSIS — I10 Essential (primary) hypertension: Secondary | ICD-10-CM

## 2011-05-22 DIAGNOSIS — E1159 Type 2 diabetes mellitus with other circulatory complications: Secondary | ICD-10-CM

## 2011-05-22 DIAGNOSIS — E1169 Type 2 diabetes mellitus with other specified complication: Secondary | ICD-10-CM

## 2011-05-22 LAB — POCT GLYCOSYLATED HEMOGLOBIN (HGB A1C): Hemoglobin A1C: 6.5

## 2011-05-22 NOTE — Progress Notes (Signed)
  Subjective:    Patient ID: Marcia Paul, female    DOB: 03-30-1945, 66 y.o.   MRN: [id number]  HPI She is here for recheck. She did quit smoking since her last visit. She has had several episodes that were stressful however she has maintained her smoke free lifestyle. She walks 3 times per week and is slowly improving the amount of time that she is walking. She continues to make good strides in handling her normal life situation especially in regard to taking more control of her own life and not becoming overly involved in other peoples issues.   Review of Systems     Objective:   Physical Exam Alert and in no distress. Hemoglobin A1c 6.5       Assessment & Plan:   1. Type II or unspecified type diabetes mellitus without mention of complication, not stated as uncontrolled  POCT HgB A1C  2. Diabetes mellitus    3. Hypertension associated with diabetes    4. Hyperlipidemia LDL goal <70    5. Obesity, morbid (more than 100 lbs over ideal weight or BMI > 40)     35 minutes spent discussing her overall care mainly with counseling. She is making strides on taking better care of herself and not getting overly involved in other peoples issues. She does tend to worry. We discussed this in detail. Recommended that she use to serenity prayer to help with this. Thus possible counseling however she would like to continue to see me for this.

## 2011-05-22 NOTE — Patient Instructions (Signed)
Keep up the good work

## 2011-05-25 ENCOUNTER — Other Ambulatory Visit: Payer: Self-pay | Admitting: Family Medicine

## 2011-06-26 ENCOUNTER — Other Ambulatory Visit: Payer: Self-pay | Admitting: Family Medicine

## 2011-07-15 ENCOUNTER — Other Ambulatory Visit: Payer: Self-pay | Admitting: Family Medicine

## 2011-07-15 DIAGNOSIS — Z1231 Encounter for screening mammogram for malignant neoplasm of breast: Secondary | ICD-10-CM

## 2011-07-24 ENCOUNTER — Ambulatory Visit
Admission: RE | Admit: 2011-07-24 | Discharge: 2011-07-24 | Disposition: A | Discharge status: Home / Self Care | Payer: BC Managed Care – PPO | Source: Ambulatory Visit | Attending: Family Medicine | Admitting: Family Medicine

## 2011-07-24 DIAGNOSIS — Z1231 Encounter for screening mammogram for malignant neoplasm of breast: Secondary | ICD-10-CM

## 2011-08-05 ENCOUNTER — Other Ambulatory Visit: Payer: Self-pay | Admitting: Family Medicine

## 2011-09-23 ENCOUNTER — Ambulatory Visit (INDEPENDENT_AMBULATORY_CARE_PROVIDER_SITE_OTHER): Payer: BC Managed Care – PPO | Admitting: Family Medicine

## 2011-09-23 ENCOUNTER — Encounter: Payer: Self-pay | Admitting: Family Medicine

## 2011-09-23 VITALS — HR 85 | Wt 399.0 lb

## 2011-09-23 DIAGNOSIS — E1169 Type 2 diabetes mellitus with other specified complication: Secondary | ICD-10-CM

## 2011-09-23 DIAGNOSIS — E119 Type 2 diabetes mellitus without complications: Secondary | ICD-10-CM

## 2011-09-23 DIAGNOSIS — E785 Hyperlipidemia, unspecified: Secondary | ICD-10-CM

## 2011-09-23 DIAGNOSIS — I10 Essential (primary) hypertension: Secondary | ICD-10-CM

## 2011-09-23 LAB — POCT GLYCOSYLATED HEMOGLOBIN (HGB A1C): Hemoglobin A1C: 7.2

## 2011-09-23 NOTE — Patient Instructions (Signed)
Look every day in the mirror and say I'm worth it.

## 2011-09-23 NOTE — Progress Notes (Signed)
  Subjective:    Patient ID: Marcia Paul, female    DOB: February 04, 1946, 66 y.o.   MRN: [id number]  HPI She is here for recheck. She has made some dietary changes however her physical activities have been limited due to some recent with issues. She continues on medications listed in the chart. She does not smoke or drink. She has had an eye exam in the last year. She does check her feet periodically.   Review of Systems     Objective:   Physical Exam Alert and in no distress. Hemoglobin A1c 7.2       Assessment & Plan:   1. Type II or unspecified type diabetes mellitus without mention of complication, not stated as uncontrolled  POCT glycosylated hemoglobin (Hb A1C)  2. Diabetes mellitus    3. Hyperlipidemia LDL goal <70    4. Hypertension associated with diabetes    5. Obesity, morbid (more than 100 lbs over ideal weight or BMI > 40)     I spent 25 minutes spent discussing diet, exercise, psychological issues with her. She does tend to put her but is once needs and desires above her own. She is having a struggle dealing with this and trying to make time to take better care of herself. In other aspects of her life she is very competent and positive however has some negative feelings towards her own health and well-being

## 2011-11-08 ENCOUNTER — Other Ambulatory Visit: Payer: Self-pay | Admitting: Family Medicine

## 2011-11-22 ENCOUNTER — Other Ambulatory Visit: Payer: Self-pay | Admitting: Family Medicine

## 2011-12-28 ENCOUNTER — Other Ambulatory Visit: Payer: Self-pay | Admitting: Family Medicine

## 2011-12-30 ENCOUNTER — Other Ambulatory Visit: Payer: Self-pay | Admitting: Family Medicine

## 2012-01-27 ENCOUNTER — Ambulatory Visit: Payer: BC Managed Care – PPO | Admitting: Family Medicine

## 2012-02-01 ENCOUNTER — Other Ambulatory Visit: Payer: Self-pay | Admitting: Family Medicine

## 2012-02-07 ENCOUNTER — Other Ambulatory Visit: Payer: Self-pay | Admitting: Family Medicine

## 2012-03-17 ENCOUNTER — Ambulatory Visit: Payer: BC Managed Care – PPO | Admitting: Family Medicine

## 2012-03-22 ENCOUNTER — Encounter: Payer: Self-pay | Admitting: Family Medicine

## 2012-03-22 ENCOUNTER — Ambulatory Visit (INDEPENDENT_AMBULATORY_CARE_PROVIDER_SITE_OTHER): Payer: BC Managed Care – PPO | Admitting: Family Medicine

## 2012-03-22 VITALS — BP 128/80 | HR 74 | Wt 373.0 lb

## 2012-03-22 DIAGNOSIS — E1159 Type 2 diabetes mellitus with other circulatory complications: Secondary | ICD-10-CM

## 2012-03-22 DIAGNOSIS — E785 Hyperlipidemia, unspecified: Secondary | ICD-10-CM

## 2012-03-22 DIAGNOSIS — E1169 Type 2 diabetes mellitus with other specified complication: Secondary | ICD-10-CM

## 2012-03-22 DIAGNOSIS — Z23 Encounter for immunization: Secondary | ICD-10-CM

## 2012-03-22 DIAGNOSIS — I10 Essential (primary) hypertension: Secondary | ICD-10-CM

## 2012-03-22 DIAGNOSIS — E119 Type 2 diabetes mellitus without complications: Secondary | ICD-10-CM

## 2012-03-22 NOTE — Progress Notes (Signed)
  Subjective:    Patient ID: Marcia Paul, female    DOB: Feb 22, 1946, 67 y.o.   MRN: [id number]  HPI She is here for recheck. She has lost a considerable amount of weight and is quite happy with this. She and her husband do help each other out quite well with this. He seemed to have a good working relationship when it comes to this. She continues on medications listed in the chart. She does check her feet regularly and has had an eye exam within the last year. She has also made a great deal of strides in regard to her psychological health. She has started to put limitations on how she is helping other people which in the past has interfered with her taking good care of herself.   Review of Systems     Objective:   Physical Exam Alert and in no distress. Hemoglobin A1c is 7.1.      Assessment & Plan:   1. Diabetes mellitus  POCT glycosylated hemoglobin (Hb A1C)  2. Need for prophylactic vaccination and inoculation against influenza  Flu vaccine greater than or equal to 3yo preservative free IM  3. Hypertension associated with diabetes    4. Hyperlipidemia LDL goal <70    5. Obesity, morbid (more than 100 lbs over ideal weight or BMI > 40)     flu shot given with risks and benefits discussed. The majority of the time was spent counseling in regard to continuing to take good care of herself. She is slowly getting used to to putting her once needs and desires ahead of other people and it is showing up in her continued weight loss and feeling more comfortable with doing this and not feeling guilty.

## 2012-03-30 ENCOUNTER — Other Ambulatory Visit: Payer: Self-pay | Admitting: Family Medicine

## 2012-04-26 ENCOUNTER — Other Ambulatory Visit: Payer: Self-pay | Admitting: Family Medicine

## 2012-04-27 ENCOUNTER — Telehealth: Payer: Self-pay | Admitting: Family Medicine

## 2012-04-27 NOTE — Telephone Encounter (Signed)
LM

## 2012-06-02 ENCOUNTER — Other Ambulatory Visit: Payer: Self-pay | Admitting: Medical

## 2012-07-27 ENCOUNTER — Ambulatory Visit (INDEPENDENT_AMBULATORY_CARE_PROVIDER_SITE_OTHER): Payer: Medicare Other | Admitting: Family Medicine

## 2012-07-27 ENCOUNTER — Encounter: Payer: Self-pay | Admitting: Family Medicine

## 2012-07-27 VITALS — BP 130/80 | HR 78 | Wt 366.0 lb

## 2012-07-27 DIAGNOSIS — E1169 Type 2 diabetes mellitus with other specified complication: Secondary | ICD-10-CM

## 2012-07-27 DIAGNOSIS — I10 Essential (primary) hypertension: Secondary | ICD-10-CM

## 2012-07-27 DIAGNOSIS — E119 Type 2 diabetes mellitus without complications: Secondary | ICD-10-CM

## 2012-07-27 DIAGNOSIS — E785 Hyperlipidemia, unspecified: Secondary | ICD-10-CM

## 2012-07-27 NOTE — Progress Notes (Signed)
  Subjective:    Marcia Paul is a 67 y.o. female who presents for follow-up of Type 2 diabetes mellitus.    Home blood sugar records: 130- 160  Current symptoms/problems NONE Daily foot checks, foot concerns: YES Last eye exam:  2 YEARS    Medication compliance: Current diet: VEG LESS STARCH LOW BAD CARB Current exercise: 10 TO 15 MIN EVERY OTHER DAY Known diabetic complications: none Cardiovascular risk factors: advanced age (older than 24 for men, 1 for women), diabetes mellitus, dyslipidemia, hypertension and obesity (BMI >= 30 kg/m2)   The following portions of the patient's history were reviewed and updated as appropriate: allergies, current medications, past family history, past medical history, past social history, past surgical history and problem list. She continues to make slow progress with weight reduction as well as with her own issues of self-esteem. ROS as in subjective above    Objective:   General appearence: alert, no distress, WD/WN   Lab Review Lab Results  Component Value Date   HGBA1C 7.1 03/22/2012   Lab Results  Component Value Date   CHOL 127 01/22/2011   HDL 56 01/22/2011   LDLCALC 56 01/22/2011   TRIG 75 01/22/2011   CHOLHDL 2.3 01/22/2011   No results found for this basename: Conce[facility] [place]     Chemistry      Component Value Date/Time   NA 139 01/22/2011 0923   K 4.5 01/22/2011 0923   CL 103 01/22/2011 0923   CO2 26 01/22/2011 0923   BUN 23 01/22/2011 0923   CREATININE 0.87 01/22/2011 0923   CREATININE 0.73 09/25/2006 0500      Component Value Date/Time   CALCIUM 10.2 01/22/2011 0923   ALKPHOS 93 01/22/2011 0923   AST 36 01/22/2011 0923   ALT 20 01/22/2011 0923   BILITOT 0.4 01/22/2011 0923        Chemistry      Component Value Date/Time   NA 139 01/22/2011 0923   K 4.5 01/22/2011 0923   CL 103 01/22/2011 0923   CO2 26 01/22/2011 0923   BUN 23 01/22/2011 0923   CREATININE 0.87 01/22/2011 0923   CREATININE  0.73 09/25/2006 0500      Component Value Date/Time   CALCIUM 10.2 01/22/2011 0923   ALKPHOS 93 01/22/2011 0923   AST 36 01/22/2011 0923   ALT 20 01/22/2011 0923   BILITOT 0.4 01/22/2011 0923    hemoglobin A1c is 7.0   Last optometry/ophthalmology exam reviewed from:    Assessment:   Encounter Diagnoses  Name Primary?  . Diabetes mellitus Yes  . Hypertension associated with diabetes   . Hyperlipidemia LDL goal <70   . Obesity, morbid (more than 100 lbs over ideal weight or BMI > 40)          Plan:    1.  Rx changes: none 2.  Education: Reviewed 'ABCs' of diabetes management (respective goals in parentheses):  A1C (<7), blood pressure (<130/80), and cholesterol (LDL <100). 3.  Compliance at present is estimated to be good. Efforts to improve compliance (if necessary) will be directed at none. 4. Follow up: 4 months   I encouraged her to continue with her present exercise and weight loss regimen. Recheck here in 4 months.

## 2012-08-08 ENCOUNTER — Other Ambulatory Visit: Payer: Self-pay | Admitting: Family Medicine

## 2012-09-03 ENCOUNTER — Other Ambulatory Visit: Payer: Self-pay | Admitting: Family Medicine

## 2012-09-07 ENCOUNTER — Telehealth: Payer: Self-pay | Admitting: Internal Medicine

## 2012-09-07 MED ORDER — SITAGLIPTIN PHOS-METFORMIN HCL 50-500 MG PO TABS: 1.0000 | ORAL_TABLET | Freq: Two times a day (BID) | ORAL | Status: DC

## 2012-09-07 NOTE — Telephone Encounter (Signed)
Pt GET 180 PILLS FOR A 90 DAY SUPPLY

## 2012-09-14 ENCOUNTER — Other Ambulatory Visit: Payer: Self-pay

## 2012-09-14 DIAGNOSIS — Z1231 Encounter for screening mammogram for malignant neoplasm of breast: Secondary | ICD-10-CM

## 2012-10-06 ENCOUNTER — Ambulatory Visit
Admission: RE | Admit: 2012-10-06 | Discharge: 2012-10-06 | Disposition: A | Discharge status: Home / Self Care | Payer: Medicare Other | Source: Ambulatory Visit

## 2012-10-06 DIAGNOSIS — Z1231 Encounter for screening mammogram for malignant neoplasm of breast: Secondary | ICD-10-CM

## 2012-11-23 ENCOUNTER — Encounter: Payer: Self-pay | Admitting: Family Medicine

## 2012-11-23 ENCOUNTER — Ambulatory Visit: Payer: Medicare Other | Admitting: Family Medicine

## 2012-11-23 ENCOUNTER — Ambulatory Visit (INDEPENDENT_AMBULATORY_CARE_PROVIDER_SITE_OTHER): Payer: Medicare Other | Admitting: Family Medicine

## 2012-11-23 DIAGNOSIS — Z23 Encounter for immunization: Secondary | ICD-10-CM

## 2012-11-23 DIAGNOSIS — I10 Essential (primary) hypertension: Secondary | ICD-10-CM

## 2012-11-23 DIAGNOSIS — E785 Hyperlipidemia, unspecified: Secondary | ICD-10-CM

## 2012-11-23 DIAGNOSIS — E1169 Type 2 diabetes mellitus with other specified complication: Secondary | ICD-10-CM

## 2012-11-23 DIAGNOSIS — E119 Type 2 diabetes mellitus without complications: Secondary | ICD-10-CM

## 2012-11-23 DIAGNOSIS — E1159 Type 2 diabetes mellitus with other circulatory complications: Secondary | ICD-10-CM

## 2012-11-23 LAB — POCT GLYCOSYLATED HEMOGLOBIN (HGB A1C): Hemoglobin A1C: 6.5

## 2012-11-23 NOTE — Progress Notes (Signed)
  Subjective:    Patient ID: Marcia Paul, female    DOB: 1945-04-09, 67 y.o.   MRN: [id number]  HPI She is here for followup on her diabetes. She has lost over 20 pounds making dietary changes. She is walking at least 3 days per week. She is finding it easier and easier to make these dietary changes but is still having some mental fights concerning this. She drinks very little and does not smoke. She has had an eye exam and does check her feet regularly. He continues on medications listed in the chart. She also has a much better attitude towards helping other people. She is now taking much better care of herself and not taking an responsibility of other peoples well-being.  Review of Systems     Objective:   Physical Exam Alert and in no distress. Hemoglobin A1c is 6.5. Her lab work was reviewed.       Assessment & Plan:  Obesity, morbid (more than 100 lbs over ideal weight or BMI > 40)  Hypertension associated with diabetes  Hyperlipidemia LDL goal <70  Diabetes mellitus - Plan: POCT glycosylated hemoglobin (Hb A1C)  Need for prophylactic vaccination and inoculation against influenza - Plan: Flu Vaccine QUAD 36+ mos IM  flu shot given with risks and benefits discussed. Encouraged her to continue with her diet and exercise regimen. Discussed the possibility of readjusting her medications based on her continued weight loss.

## 2013-01-09 ENCOUNTER — Other Ambulatory Visit: Payer: Self-pay | Admitting: Family Medicine

## 2013-02-06 ENCOUNTER — Other Ambulatory Visit: Payer: Self-pay | Admitting: Family Medicine

## 2013-03-01 ENCOUNTER — Other Ambulatory Visit: Payer: Self-pay | Admitting: Family Medicine

## 2013-03-28 ENCOUNTER — Ambulatory Visit: Payer: Medicare Other | Admitting: Family Medicine

## 2013-04-14 ENCOUNTER — Ambulatory Visit (INDEPENDENT_AMBULATORY_CARE_PROVIDER_SITE_OTHER): Payer: Medicare Other | Admitting: Family Medicine

## 2013-04-14 ENCOUNTER — Encounter: Payer: Self-pay | Admitting: Family Medicine

## 2013-04-14 DIAGNOSIS — Z79899 Other long term (current) drug therapy: Secondary | ICD-10-CM

## 2013-04-14 DIAGNOSIS — E1169 Type 2 diabetes mellitus with other specified complication: Secondary | ICD-10-CM

## 2013-04-14 DIAGNOSIS — E119 Type 2 diabetes mellitus without complications: Secondary | ICD-10-CM

## 2013-04-14 DIAGNOSIS — I152 Hypertension secondary to endocrine disorders: Secondary | ICD-10-CM

## 2013-04-14 DIAGNOSIS — I10 Essential (primary) hypertension: Secondary | ICD-10-CM

## 2013-04-14 DIAGNOSIS — E1159 Type 2 diabetes mellitus with other circulatory complications: Secondary | ICD-10-CM

## 2013-04-14 DIAGNOSIS — E785 Hyperlipidemia, unspecified: Secondary | ICD-10-CM

## 2013-04-14 LAB — CBC WITH DIFFERENTIAL/PLATELET
BASOS ABS: 0 10*3/uL (ref 0.0–0.1)
Basophils Relative: 0 % (ref 0–1)
Eosinophils Absolute: 0.1 10*3/uL (ref 0.0–0.7)
Eosinophils Relative: 1 % (ref 0–5)
HEMATOCRIT: 39.8 % (ref 36.0–46.0)
Hemoglobin: 13.5 g/dL (ref 12.0–15.0)
LYMPHS PCT: 24 % (ref 12–46)
Lymphs Abs: 2.1 10*3/uL (ref 0.7–4.0)
MCH: 28.3 pg (ref 26.0–34.0)
MCHC: 33.9 g/dL (ref 30.0–36.0)
MCV: 83.4 fL (ref 78.0–100.0)
Monocytes Absolute: 0.6 10*3/uL (ref 0.1–1.0)
Monocytes Relative: 7 % (ref 3–12)
Neutro Abs: 5.8 10*3/uL (ref 1.7–7.7)
Neutrophils Relative %: 68 % (ref 43–77)
PLATELETS: 309 10*3/uL (ref 150–400)
RBC: 4.77 MIL/uL (ref 3.87–5.11)
RDW: 14.1 % (ref 11.5–15.5)
WBC: 8.6 10*3/uL (ref 4.0–10.5)

## 2013-04-14 LAB — COMPREHENSIVE METABOLIC PANEL
ALBUMIN: 4.6 g/dL (ref 3.5–5.2)
ALT: 21 U/L (ref 0–35)
AST: 31 U/L (ref 0–37)
Alkaline Phosphatase: 102 U/L (ref 39–117)
BUN: 16 mg/dL (ref 6–23)
CO2: 27 mEq/L (ref 19–32)
Calcium: 10.2 mg/dL (ref 8.4–10.5)
Chloride: 97 mEq/L (ref 96–112)
Creat: 0.79 mg/dL (ref 0.50–1.10)
Glucose, Bld: 137 mg/dL — ABNORMAL HIGH (ref 70–99)
POTASSIUM: 3.8 meq/L (ref 3.5–5.3)
Sodium: 137 mEq/L (ref 135–145)
TOTAL PROTEIN: 7.4 g/dL (ref 6.0–8.3)
Total Bilirubin: 0.6 mg/dL (ref 0.2–1.2)

## 2013-04-14 NOTE — Progress Notes (Signed)
   Subjective:    Patient ID: Marcia Paul, female    DOB: [birth year], 68 y.o.   MRN: [id number]  HPI She is here for recheck. She admits to not taking good care of herself and allowing other people's wants needs and desires to take precedent over herself again. She realizes that this is not healthy. She continues on medications listed in the chart. Her physical activity level is quite limited. Smoking and drinking were reviewed. She did have an eye exam in the last year. She does check her feet regularly. Her weight was reviewed.   Review of Systems     Objective:   Physical Exam Alert and in no distress. Hemoglobin A1c is 7.3.       Assessment & Plan:  Obesity, morbid (more than 100 lbs over ideal weight or BMI > 40)  Hypertension associated with diabetes  Hyperlipidemia LDL goal <70  Diabetes mellitus - Plan: CBC with Differential, Comprehensive metabolic panel, POCT UA - Microalbumin, POCT glycosylated hemoglobin (Hb A1C)  Encounter for long-term (current) use of other medications - Plan: CBC with Differential, Comprehensive metabolic panel, POCT UA - Microalbumin, POCT glycosylated hemoglobin (Hb A1C)  I again encouraged her to take better care of herself and she has been.

## 2013-04-15 LAB — POCT UA - MICROALBUMIN
Albumin/Creatinine Ratio, Urine, POC: 45.5
Creatinine, POC: 206.7 mg/dL
MICROALBUMIN (UR) POC: 94.1 mg/L

## 2013-04-15 LAB — POCT GLYCOSYLATED HEMOGLOBIN (HGB A1C): Hemoglobin A1C: 7.2

## 2013-05-16 LAB — HM DIABETES EYE EXAM

## 2013-05-24 ENCOUNTER — Telehealth: Payer: Self-pay | Admitting: Family Medicine

## 2013-05-24 NOTE — Telephone Encounter (Signed)
Please call , Marcia QuinLinda has not heard about her labs from her last visit

## 2013-05-24 NOTE — Telephone Encounter (Signed)
Apologize to her and let her know that her blood work looks great

## 2013-05-24 NOTE — Telephone Encounter (Signed)
Called and LM for PTCB

## 2013-05-25 NOTE — Telephone Encounter (Signed)
Informed patient of lab results and apologized for the delay. Sent copy of labs to patient per her request.

## 2013-05-27 ENCOUNTER — Encounter: Payer: Self-pay | Admitting: Internal Medicine

## 2013-05-27 ENCOUNTER — Encounter: Payer: Self-pay | Admitting: Family Medicine

## 2013-06-04 ENCOUNTER — Other Ambulatory Visit: Payer: Self-pay | Admitting: Family Medicine

## 2013-08-11 ENCOUNTER — Encounter: Payer: Self-pay | Admitting: Family Medicine

## 2013-08-11 ENCOUNTER — Ambulatory Visit (INDEPENDENT_AMBULATORY_CARE_PROVIDER_SITE_OTHER): Payer: Medicare Other | Admitting: Family Medicine

## 2013-08-11 VITALS — BP 110/90 | Wt 367.0 lb

## 2013-08-11 DIAGNOSIS — E1169 Type 2 diabetes mellitus with other specified complication: Secondary | ICD-10-CM

## 2013-08-11 DIAGNOSIS — Z79899 Other long term (current) drug therapy: Secondary | ICD-10-CM

## 2013-08-11 DIAGNOSIS — E1159 Type 2 diabetes mellitus with other circulatory complications: Secondary | ICD-10-CM

## 2013-08-11 DIAGNOSIS — I10 Essential (primary) hypertension: Secondary | ICD-10-CM

## 2013-08-11 DIAGNOSIS — E785 Hyperlipidemia, unspecified: Secondary | ICD-10-CM

## 2013-08-11 DIAGNOSIS — E119 Type 2 diabetes mellitus without complications: Secondary | ICD-10-CM

## 2013-08-11 LAB — POCT GLYCOSYLATED HEMOGLOBIN (HGB A1C): Hemoglobin A1C: 7.1

## 2013-08-11 NOTE — Progress Notes (Signed)
   Subjective:    Patient ID: Marcia Paul, female    DOB: [birth year], 68 y.o.   MRN: [id number]  HPI She is here for a diabetes check. She is doing quite well, continuing to work and having no difficulty with cognitive function. She has no symptoms of depression. She has not had an ABI. She walks for 20 minutes 4 times per week. She did have a fall several months ago but is doing well now. She continues on medications listed in the chart. Does check her blood sugars daily and the numbers run 130-150. Her last eye exam was January. She does check her feet regularly. Smoking and drinking were reviewed. Her medications were also reviewed. Her work and home life are going quite well.  Review of Systems     Objective:   Physical Exam Hurt and in no distress otherwise not examined. HbA1c is 7.1.       Assessment & Plan:  Diabetes mellitus - Plan: HgB A1c, Ankle brachial index, Lipid panel  Hypertension associated with diabetes  Hyperlipidemia LDL goal <70 - Plan: Lipid panel  Obesity, morbid (more than 100 lbs over ideal weight or BMI > 40)  Encounter for long-term (current) use of other medications - Plan: Lipid panel  encouraged her to continue with her physical activity area she plans to try and walk in a run/ walk event over the Christmas holidays coming up.

## 2013-08-12 LAB — LIPID PANEL
CHOL/HDL RATIO: 2.7 ratio
CHOLESTEROL: 146 mg/dL (ref 0–200)
HDL: 54 mg/dL (ref >39)
LDL CALC: 71 mg/dL (ref 0–99)
TRIGLYCERIDES: 105 mg/dL (ref <150)
VLDL: 21 mg/dL (ref 0–40)

## 2013-08-14 ENCOUNTER — Other Ambulatory Visit: Payer: Self-pay | Admitting: Family Medicine

## 2013-08-15 ENCOUNTER — Encounter (HOSPITAL_COMMUNITY): Payer: Medicare Other

## 2013-08-15 ENCOUNTER — Telehealth: Payer: Self-pay

## 2013-08-15 NOTE — Telephone Encounter (Signed)
Pt.notified

## 2013-08-15 NOTE — Telephone Encounter (Signed)
I think she miss interpreted what I said. Let her know that we do not do that here in the office

## 2013-08-15 NOTE — Telephone Encounter (Signed)
Pt states you told her that she did not have to have her ABI done at the hospital, I was not aware of this until just now and pt is [facility]. Pt states you told her that this could be done in the office. Please advise

## 2013-08-16 ENCOUNTER — Ambulatory Visit (HOSPITAL_COMMUNITY)
Admission: RE | Admit: 2013-08-16 | Discharge: 2013-08-16 | Disposition: A | Discharge status: Home / Self Care | Payer: Medicare Other | Source: Ambulatory Visit | Attending: Family Medicine | Admitting: Family Medicine

## 2013-08-16 ENCOUNTER — Telehealth: Payer: Self-pay | Admitting: Family Medicine

## 2013-08-16 DIAGNOSIS — E119 Type 2 diabetes mellitus without complications: Secondary | ICD-10-CM | POA: Insufficient documentation

## 2013-08-16 DIAGNOSIS — R209 Unspecified disturbances of skin sensation: Secondary | ICD-10-CM | POA: Insufficient documentation

## 2013-08-16 DIAGNOSIS — E669 Obesity, unspecified: Secondary | ICD-10-CM | POA: Insufficient documentation

## 2013-08-16 MED ORDER — SIMVASTATIN 40 MG PO TABS: ORAL_TABLET | ORAL | Status: DC

## 2013-08-16 NOTE — Progress Notes (Signed)
VASCULAR LAB PRELIMINARY  PRELIMINARY  PRELIMINARY  PRELIMINARY  ABI completed.    Preliminary report:  Normal pedal waveforms at rest.  Final report to follow.  Datra Clary, RVT 08/16/2013, 2:36 PM

## 2013-08-16 NOTE — Telephone Encounter (Signed)
Pt needs refill simvastatin  90 day supply with refills  [facility]   Also simvastatin is listed on meds twice, can someone fix that

## 2013-08-31 ENCOUNTER — Other Ambulatory Visit: Payer: Self-pay | Admitting: Family Medicine

## 2013-09-18 ENCOUNTER — Emergency Department (HOSPITAL_COMMUNITY)
Admission: EM | Admit: 2013-09-18 | Discharge: 2013-09-19 | Disposition: A | Discharge status: Home / Self Care | Payer: Medicare Other | Attending: Emergency Medicine | Admitting: Emergency Medicine

## 2013-09-18 ENCOUNTER — Emergency Department (HOSPITAL_COMMUNITY): Payer: Medicare Other

## 2013-09-18 ENCOUNTER — Encounter (HOSPITAL_COMMUNITY): Payer: Self-pay | Admitting: Emergency Medicine

## 2013-09-18 DIAGNOSIS — Z8669 Personal history of other diseases of the nervous system and sense organs: Secondary | ICD-10-CM | POA: Insufficient documentation

## 2013-09-18 DIAGNOSIS — Z87891 Personal history of nicotine dependence: Secondary | ICD-10-CM | POA: Insufficient documentation

## 2013-09-18 DIAGNOSIS — K5900 Constipation, unspecified: Secondary | ICD-10-CM | POA: Insufficient documentation

## 2013-09-18 DIAGNOSIS — Z7982 Long term (current) use of aspirin: Secondary | ICD-10-CM | POA: Insufficient documentation

## 2013-09-18 DIAGNOSIS — Z79899 Other long term (current) drug therapy: Secondary | ICD-10-CM | POA: Insufficient documentation

## 2013-09-18 DIAGNOSIS — M545 Low back pain, unspecified: Secondary | ICD-10-CM | POA: Insufficient documentation

## 2013-09-18 DIAGNOSIS — I10 Essential (primary) hypertension: Secondary | ICD-10-CM | POA: Insufficient documentation

## 2013-09-18 DIAGNOSIS — E119 Type 2 diabetes mellitus without complications: Secondary | ICD-10-CM | POA: Insufficient documentation

## 2013-09-18 DIAGNOSIS — K625 Hemorrhage of anus and rectum: Secondary | ICD-10-CM | POA: Insufficient documentation

## 2013-09-18 LAB — CBC
HCT: 35.6 % — ABNORMAL LOW (ref 36.0–46.0)
Hemoglobin: 11.9 g/dL — ABNORMAL LOW (ref 12.0–15.0)
MCH: 28.9 pg (ref 26.0–34.0)
MCHC: 33.4 g/dL (ref 30.0–36.0)
MCV: 86.4 fL (ref 78.0–100.0)
Platelets: 276 10*3/uL (ref 150–400)
RBC: 4.12 MIL/uL (ref 3.87–5.11)
RDW: 13.1 % (ref 11.5–15.5)
WBC: 13 10*3/uL — ABNORMAL HIGH (ref 4.0–10.5)

## 2013-09-18 LAB — I-STAT CHEM 8, ED
BUN: 12 mg/dL (ref 6–23)
CALCIUM ION: 1.2 mmol/L (ref 1.13–1.30)
CREATININE: 0.9 mg/dL (ref 0.50–1.10)
Chloride: 102 mEq/L (ref 96–112)
GLUCOSE: 176 mg/dL — AB (ref 70–99)
HCT: 37 % (ref 36.0–46.0)
Hemoglobin: 12.6 g/dL (ref 12.0–15.0)
Potassium: 3.1 mEq/L — ABNORMAL LOW (ref 3.7–5.3)
Sodium: 136 mEq/L — ABNORMAL LOW (ref 137–147)
TCO2: 19 mmol/L (ref 0–100)

## 2013-09-18 MED ORDER — SODIUM CHLORIDE 0.9 % IV BOLUS (SEPSIS)
1000.0000 mL | INTRAVENOUS | Status: AC
  Administered 2013-09-18: 1000 mL via INTRAVENOUS

## 2013-09-18 MED ORDER — LACTULOSE 10 GM/15ML PO SOLN
20.0000 g | Freq: Once | ORAL | Status: AC
  Administered 2013-09-18: 20 g via ORAL
  Filled 2013-09-18: qty 30

## 2013-09-18 MED ORDER — ONDANSETRON HCL 4 MG/2ML IJ SOLN
4.0000 mg | Freq: Once | INTRAMUSCULAR | Status: AC
  Administered 2013-09-18: 4 mg via INTRAVENOUS
  Filled 2013-09-18: qty 2

## 2013-09-18 MED ORDER — IOHEXOL 300 MG/ML  SOLN
125.0000 mL | Freq: Once | INTRAMUSCULAR | Status: AC | PRN
  Administered 2013-09-18: 125 mL via INTRAVENOUS

## 2013-09-18 MED ORDER — BISACODYL 10 MG RE SUPP
10.0000 mg | Freq: Once | RECTAL | Status: AC
  Administered 2013-09-18: 10 mg via RECTAL
  Filled 2013-09-18: qty 1

## 2013-09-18 MED ORDER — HYDROMORPHONE HCL PF 1 MG/ML IJ SOLN
1.0000 mg | Freq: Once | INTRAMUSCULAR | Status: AC
  Administered 2013-09-18: 1 mg via INTRAVENOUS
  Filled 2013-09-18: qty 1

## 2013-09-18 MED ORDER — DIAZEPAM 5 MG/ML IJ SOLN
10.0000 mg | Freq: Once | INTRAMUSCULAR | Status: AC
  Administered 2013-09-18: 10 mg via INTRAVENOUS
  Filled 2013-09-18: qty 2

## 2013-09-18 MED ORDER — HYDROMORPHONE HCL PF 2 MG/ML IJ SOLN
2.0000 mg | Freq: Once | INTRAMUSCULAR | Status: AC
  Administered 2013-09-18: 2 mg via INTRAVENOUS
  Filled 2013-09-18: qty 1

## 2013-09-18 MED ORDER — MILK AND MOLASSES ENEMA
1.0000 | Freq: Once | RECTAL | Status: AC
  Administered 2013-09-18: 250 mL via RECTAL
  Filled 2013-09-18: qty 250

## 2013-09-18 NOTE — ED Notes (Signed)
Bedside report received from previous RN, Matt. 

## 2013-09-18 NOTE — ED Notes (Addendum)
Per EMS, pt states she has had back pain and constipation for the past 4 days. Pt states she began taking laxatives 4 days ago, which caused her to have blood in her stool. Pt began taking stool softeners yesterday, which she states has not helped her have a BM. Pt states she has had liquid BM's for the past 3 days, but no solid BMs.

## 2013-09-18 NOTE — ED Notes (Signed)
Bed: WA01 Expected date:  Expected time:  Means of arrival:  Comments: EMS 

## 2013-09-18 NOTE — ED Provider Notes (Signed)
CSN: [id number]     Arrival date & time 09/18/13  1705 History   First MD Initiated Contact with Patient 09/18/13 1723     Chief Complaint  Patient presents with  . Back Pain  . Constipation     (Consider location/radiation/quality/duration/timing/severity/associated sxs/prior Treatment) The history is provided by the patient and medical records. No language interpreter was used.    Marcia Paul is a 68 y.o. female  with a hx of HTN, hematuria, obesity, NIDDM presents to the Emergency Department complaining of gradual, persistent, progressively worsening low back pain and constipation onset 4 days ago.  Pt reports she began to take laxatives without relief and now with bright red blood in association with brown stool.  Patient denies black, tarry or coffee ground stools.   Pt reports hx of compression fractures without complication and no chronic back pain.  Pt reports watery diarrhea for the last few days with intermittent very hard small pellets.  Pt denies falls or injuries.  Pt reports she commonly has this pain with constipation.  Pt denies use of narctoic pain medications.  Pt denies blood thinners (baby ASA), cancer, IVDU, trauma.  Patient denies fever, chills, headache, neck pain, chest pain, shortness of breath, abdominal pain, nausea, vomiting, diarrhea, weakness, dizziness, syncope, dysuria, hematuria.  No alleviating factors at this time.   Past Medical History  Diagnosis Date  . Hypertension   . Hematuria   . Cataract     EARLY  . Obesity, morbid   . Diabetes mellitus     AODM  . Former smoker     QUITS 05/2007   Past Surgical History  Procedure Laterality Date  . Abdominal hysterectomy  09/17/2006   No family history on file. History  Substance Use Topics  . Smoking status: Former Smoker    Quit date: 01/02/2011  . Smokeless tobacco: Not on file  . Alcohol Use: No   OB History   Grav Para Term Preterm Abortions TAB SAB Ect Mult Living                  Review of Systems  Constitutional: Negative for fever, diaphoresis, appetite change, fatigue and unexpected weight change.  HENT: Negative for mouth sores.   Eyes: Negative for visual disturbance.  Respiratory: Negative for cough, chest tightness, shortness of breath and wheezing.   Cardiovascular: Negative for chest pain.  Gastrointestinal: Positive for constipation and anal bleeding. Negative for nausea, vomiting, abdominal pain and diarrhea.  Endocrine: Negative for polydipsia, polyphagia and polyuria.  Genitourinary: Negative for dysuria, urgency, frequency and hematuria.  Musculoskeletal: Positive for back pain. Negative for neck stiffness.  Skin: Negative for rash.  Allergic/Immunologic: Negative for immunocompromised state.  Neurological: Negative for syncope, light-headedness and headaches.  Hematological: Does not bruise/bleed easily.  Psychiatric/Behavioral: Negative for sleep disturbance. The patient is not nervous/anxious.       Allergies  Darvon  Home Medications   Prior to Admission medications   Medication Sig Start Date End Date Taking? Authorizing Provider  aspirin 81 MG tablet Take 81 mg by mouth daily.     Yes Historical Provider, MD  BENICAR HCT 40-25 MG per tablet TAKE 1 TABLET BY MOUTH EVERY DAY 08/15/13  Yes Ronnald Nian, MD  cholecalciferol (VITAMIN D) 1000 UNITS tablet Take 1,000 Units by mouth daily.   Yes Historical Provider, MD  fish oil-omega-3 fatty acids 1000 MG capsule Take 2 g by mouth daily.   Yes Historical Provider, MD  JANUMET 50-500 MG  per tablet TAKE 1 TABLET BY MOUTH 2 (TWO) TIMES DAILY WITH A MEAL.   Yes Ronnald NianJohn C Lalonde, MD  Multiple Vitamin (MULTIVITAMIN) capsule Take 1 capsule by mouth daily.     Yes Historical Provider, MD  Nettle, Urtica Dioica, 435 MG CAPS Take 1 capsule by mouth daily.    Yes Historical Provider, MD  simvastatin (ZOCOR) 40 MG tablet TAKE 1 TABLET EVERY DAY 08/16/13  Yes Ronnald NianJohn C Lalonde, MD  methocarbamol (ROBAXIN-750)  750 MG tablet Take 1 tablet (750 mg total) by mouth 4 (four) times daily. 09/19/13   Sukaina Toothaker, PA-C  ondansetron (ZOFRAN ODT) 4 MG disintegrating tablet 4mg  ODT q4 hours prn nausea/vomit 09/19/13   Seleni Meller, PA-C  oxyCODONE-acetaminophen (PERCOCET/ROXICET) 5-325 MG per tablet Take 1-2 tablets by mouth every 4 (four) hours as needed for moderate pain or severe pain. 09/19/13   Preslynn Bier, PA-C  polyethylene glycol (GOLYTELY) 236 G solution Take 2,000 mLs by mouth once. Drink 12oz per hour until you have a bowel movement 09/19/13   Tambi Thole, PA-C   BP 133/64  Pulse 73  Temp(Src) 97.9 F (36.6 C) (Oral)  Resp 20  SpO2 94% Physical Exam  Nursing note and vitals reviewed. Constitutional: She appears well-developed and well-nourished. No distress.  Awake, alert, nontoxic appearance  HENT:  Head: Normocephalic and atraumatic.  Mouth/Throat: Oropharynx is clear and moist. No oropharyngeal exudate.  Eyes: Conjunctivae are normal. No scleral icterus.  Neck: Normal range of motion. Neck supple.  Full ROM without pain  Cardiovascular: Normal rate, regular rhythm, normal heart sounds and intact distal pulses.   No murmur heard. Pulmonary/Chest: Effort normal and breath sounds normal. No respiratory distress. She has no wheezes.  Abdominal: Soft. Bowel sounds are normal. She exhibits no distension and no mass. There is no tenderness. There is no rebound and no guarding.  Obese Soft and nontender  Genitourinary: Rectal exam shows no external hemorrhoid, no fissure, no mass, no tenderness and anal tone normal.  Musculoskeletal: Normal range of motion. She exhibits no edema.  Full range of motion of the T-spine and L-spine No tenderness to palpation of the spinous processes of the T-spine or L-spine Mild tenderness to palpation of the paraspinous muscles of the L-spine - left worse than right - though technically difficult exam due to patient weight   Lymphadenopathy:    She has no cervical adenopathy.  Neurological: She is alert. She has normal reflexes.  Speech is clear and goal oriented, follows commands Normal 5/5 strength in upper and lower extremities bilaterally including dorsiflexion and plantar flexion, strong and equal grip strength Sensation normal to light and sharp touch Moves extremities without ataxia, coordination intact Gait testing deferred initially due to significant pain  Skin: Skin is warm and dry. No rash noted. She is not diaphoretic. No erythema.  Psychiatric: She has a normal mood and affect. Her behavior is normal.    ED Course  Procedures (including critical care time) Labs Review Labs Reviewed  CBC - Abnormal; Notable for the following:    WBC 13.0 (*)    Hemoglobin 11.9 (*)    HCT 35.6 (*)    All other components within normal limits  I-STAT CHEM 8, ED - Abnormal; Notable for the following:    Sodium 136 (*)    Potassium 3.1 (*)    Glucose, Bld 176 (*)    All other components within normal limits    Imaging Review Ct Abdomen Pelvis W Contrast  09/18/2013  CLINICAL DATA:  Diffuse abdominal pain and weakness.  EXAM: CT ABDOMEN AND PELVIS WITH CONTRAST  TECHNIQUE: Multidetector CT imaging of the abdomen and pelvis was performed using the standard protocol following bolus administration of intravenous contrast.  CONTRAST:  OMNIPAQUE IOHEXOL 300 MG/ML  SOLN  COMPARISON:  CT of the abdomen and pelvis performed 06/27/2005  FINDINGS: Minimal bibasilar atelectasis is noted.  The patient is status post cholecystectomy. Minimal prominence of the intrahepatic biliary ducts remains within normal limits status post cholecystectomy. The liver is otherwise unremarkable. The spleen is within normal limits. The pancreas and adrenal glands are unremarkable.  A 2.9 cm hypodensity is noted at the posterior aspect of the right kidney. Minimal nonspecific perinephric stranding is noted bilaterally. There is incomplete  rotation of the right kidney. The kidneys are otherwise unremarkable in appearance. There is no evidence of hydronephrosis. No renal or ureteral stones are seen.  No free fluid is identified. The small bowel is unremarkable in appearance. The stomach is within normal limits. No acute vascular abnormalities are seen.  The appendix is not definitely seen; there is no evidence for appendicitis. Diverticulosis is noted along the descending and sigmoid colon, without evidence of diverticulitis.  The bladder is mildly distended and grossly unremarkable. The patient is status post hysterectomy. No suspicious adnexal masses are seen. No inguinal lymphadenopathy is seen.  No acute osseous abnormalities are identified. Facet disease is noted at the lower lumbar spine.  IMPRESSION: 1. No acute abnormality seen within the abdomen or pelvis. 2. Diverticulosis along the descending and sigmoid colon, without evidence of diverticulitis. 3. Right renal cyst noted.   Electronically Signed   By: Roanna Raider M.D.   On: 09/18/2013 23:48     EKG Interpretation None      MDM   Final diagnoses:  Bilateral low back pain without sciatica  Constipation, unspecified constipation type   [name] presents with back pain consistent with previous episodes of constipation. She has been using OTC treatments without relief and now c/o associated hemorrhoids. Will give pain control and enema.    8:28 PM DRE without visible external hemorrhoids, no gross blood noted on exam. No impacted stool on exam.  Rectal tone normal, no urinary or fecal incontinence.  Enema given at this time.  9:43 PM Pt with persistent back pain after Dilaudid 4mg  IV.  Minimal defecation after enema.  Will obtain CT abd/pelvis with spine reconstruction to rule out AAA, compression fracture, diverticulosis, retroperitoneal hemorrhage or other pathology.  12:21 AM CT abd/pelvis without acute abnormality.  Pt does walk, but with great pain.  She  has normal sensation in her legs.  No concern for cauda equina.  Pt continues to report that this pain is the same as previous episodes of constipation.  Pt to be d/c home with treatments for MSK back pain and Golytely for her constipation.    I have personally reviewed patient's vitals, nursing note and any pertinent labs or imaging.  I performed an undressed physical exam.    At this time, it has been determined that no acute conditions requiring further emergency intervention. The patient/guardian have been advised of the diagnosis and plan. I reviewed all labs and imaging including any potential incidental findings. We have discussed signs and symptoms that warrant return to the ED, such as gait disturbance, loss of bowel or bladder control, worsened pain, high fevers.  Patient/guardian has voiced understanding and agreed to follow-up with the PCP or specialist in 3 days.  Vital signs are stable at discharge.   BP 133/64  Pulse 73  Temp(Src) 97.9 F (36.6 C) (Oral)  Resp 20  SpO2 94%        Dierdre Forth, PA-C 09/19/13 0024

## 2013-09-19 MED ORDER — ONDANSETRON 4 MG PO TBDP: ORAL_TABLET | ORAL | Status: DC

## 2013-09-19 MED ORDER — ONDANSETRON HCL 4 MG/2ML IJ SOLN
4.0000 mg | Freq: Once | INTRAMUSCULAR | Status: AC
  Administered 2013-09-19: 4 mg via INTRAVENOUS
  Filled 2013-09-19: qty 2

## 2013-09-19 MED ORDER — METHOCARBAMOL 750 MG PO TABS: 750.0000 mg | ORAL_TABLET | Freq: Four times a day (QID) | ORAL | Status: DC

## 2013-09-19 MED ORDER — PEG 3350-KCL-NABCB-NACL-NASULF 236 G PO SOLR: 2.0000 L | Freq: Once | ORAL | Status: DC

## 2013-09-19 MED ORDER — METHOCARBAMOL 1000 MG/10ML IJ SOLN
1000.0000 mg | Freq: Once | INTRAVENOUS | Status: AC
  Administered 2013-09-19: 1000 mg via INTRAVENOUS
  Filled 2013-09-19: qty 10

## 2013-09-19 MED ORDER — OXYCODONE-ACETAMINOPHEN 5-325 MG PO TABS: 1.0000 | ORAL_TABLET | ORAL | Status: DC | PRN

## 2013-09-19 NOTE — Discharge Instructions (Signed)
1. Medications: vicodin, robaxin, zofran, golytely, usual home medications 2. Treatment: rest, drink plenty of fluids, take golytely until you have a bowel movement 3. Follow Up: Please followup with your primary doctor for discussion of your diagnoses and further evaluation after today's visit; if you do not have a primary care doctor use the resource guide provided to find one;    Constipation Constipation is when a person has fewer than three bowel movements a week, has difficulty having a bowel movement, or has stools that are dry, hard, or larger than normal. As people grow older, constipation is more common. If you try to fix constipation with medicines that make you have a bowel movement (laxatives), the problem may get worse. Long-term laxative use may cause the muscles of the colon to become weak. A low-fiber diet, not taking in enough fluids, and taking certain medicines may make constipation worse.  CAUSES   Certain medicines, such as antidepressants, pain medicine, iron supplements, antacids, and water pills.   Certain diseases, such as diabetes, irritable bowel syndrome (IBS), thyroid disease, or depression.   Not drinking enough water.   Not eating enough fiber-rich foods.   Stress or travel.   Lack of physical activity or exercise.   Ignoring the urge to have a bowel movement.   Using laxatives too much.  SIGNS AND SYMPTOMS   Having fewer than three bowel movements a week.   Straining to have a bowel movement.   Having stools that are hard, dry, or larger than normal.   Feeling full or bloated.   Pain in the lower abdomen.   Not feeling relief after having a bowel movement.  DIAGNOSIS  Your health care provider will take a medical history and perform a physical exam. Further testing may be done for severe constipation. Some tests may include:  A barium enema X-ray to examine your rectum, colon, and, sometimes, your small intestine.   A  sigmoidoscopy to examine your lower colon.   A colonoscopy to examine your entire colon. TREATMENT  Treatment will depend on the severity of your constipation and what is causing it. Some dietary treatments include drinking more fluids and eating more fiber-rich foods. Lifestyle treatments may include regular exercise. If these diet and lifestyle recommendations do not help, your health care provider may recommend taking over-the-counter laxative medicines to help you have bowel movements. Prescription medicines may be prescribed if over-the-counter medicines do not work.  HOME CARE INSTRUCTIONS   Eat foods that have a lot of fiber, such as fruits, vegetables, whole grains, and beans.  Limit foods high in fat and processed sugars, such as french fries, hamburgers, cookies, candies, and soda.   A fiber supplement may be added to your diet if you cannot get enough fiber from foods.   Drink enough fluids to keep your urine clear or pale yellow.   Exercise regularly or as directed by your health care provider.   Go to the restroom when you have the urge to go. Do not hold it.   Only take over-the-counter or prescription medicines as directed by your health care provider. Do not take other medicines for constipation without talking to your health care provider first.  SEEK IMMEDIATE MEDICAL CARE IF:   You have bright red blood in your stool.   Your constipation lasts for more than 4 days or gets worse.   You have abdominal or rectal pain.   You have thin, pencil-like stools.   You have unexplained weight loss. MAKE  SURE YOU:   Understand these instructions.  Will watch your condition.  Will get help right away if you are not doing well or get worse. Document Released: 11/16/2003 Document Revised: 02/22/2013 Document Reviewed: 11/29/2012 [facility] Patient Information 2015 [employer], [place]. This information is not intended to replace advice given to you by your health care  provider. Make sure you discuss any questions you have with your health care provider.  Back Pain, Adult Low back pain is very common. About 1 in 5 people have back pain.The cause of low back pain is rarely dangerous. The pain often gets better over time.About half of people with a sudden onset of back pain feel better in just 2 weeks. About 8 in 10 people feel better by 6 weeks.  CAUSES Some common causes of back pain include:  Strain of the muscles or ligaments supporting the spine.  Wear and tear (degeneration) of the spinal discs.  Arthritis.  Direct injury to the back. DIAGNOSIS Most of the time, the direct cause of low back pain is not known.However, back pain can be treated effectively even when the exact cause of the pain is unknown.Answering your caregiver's questions about your overall health and symptoms is one of the most accurate ways to make sure the cause of your pain is not dangerous. If your caregiver needs more information, he or she may order lab work or imaging tests (X-rays or MRIs).However, even if imaging tests show changes in your back, this usually does not require surgery. HOME CARE INSTRUCTIONS For many people, back pain returns.Since low back pain is rarely dangerous, it is often a condition that people can learn to [facility] Hospitalmanageon their own.   Remain active. It is stressful on the back to sit or stand in one place. Do not sit, drive, or stand in one place for more than 30 minutes at a time. Take short walks on level surfaces as soon as pain allows.Try to increase the length of time you walk each day.  Do not stay in bed.Resting more than 1 or 2 days can delay your recovery.  Do not avoid exercise or work.Your body is made to move.It is not dangerous to be active, even though your back may hurt.Your back will likely heal faster if you return to being active before your pain is gone.  Pay attention to your body when you bend and lift. Many people have less  discomfortwhen lifting if they bend their knees, keep the load close to their bodies,and avoid twisting. Often, the most comfortable positions are those that put less stress on your recovering back.  Find a comfortable position to sleep. Use a firm mattress and lie on your side with your knees slightly bent. If you lie on your back, put a pillow under your knees.  Only take over-the-counter or prescription medicines as directed by your caregiver. Over-the-counter medicines to reduce pain and inflammation are often the most helpful.Your caregiver may prescribe muscle relaxant drugs.These medicines help dull your pain so you can more quickly return to your normal activities and healthy exercise.  Put ice on the injured area.  Put ice in a plastic bag.  Place a towel between your skin and the bag.  Leave the ice on for 15-20 minutes, 03-04 times a day for the first 2 to 3 days. After that, ice and heat may be alternated to reduce pain and spasms.  Ask your caregiver about trying back exercises and gentle massage. This may be of some benefit.  Avoid feeling anxious or stressed.Stress increases muscle tension and can worsen back pain.It is important to recognize when you are anxious or stressed and learn ways to manage it.Exercise is a great option. SEEK MEDICAL CARE IF:  You have pain that is not relieved with rest or medicine.  You have pain that does not improve in 1 week.  You have new symptoms.  You are generally not feeling well. SEEK IMMEDIATE MEDICAL CARE IF:   You have pain that radiates from your back into your legs.  You develop new bowel or bladder control problems.  You have unusual weakness or numbness in your arms or legs.  You develop nausea or vomiting.  You develop abdominal pain.  You feel faint. Document Released: 02/17/2005 Document Revised: 08/19/2011 Document Reviewed: 07/08/2010 [facility] Patient Information 2015 [place], [place]. This information is  not intended to replace advice given to you by your health care provider. Make sure you discuss any questions you have with your health care provider.

## 2013-09-21 NOTE — ED Provider Notes (Signed)
Medical screening examination/treatment/procedure(s) were performed by non-physician practitioner and as supervising physician I was immediately available for consultation/collaboration.   EKG Interpretation None        Gilda Creasehristopher J. Theron Cumbie, MD 09/21/13 1105

## 2013-12-04 ENCOUNTER — Other Ambulatory Visit: Payer: Self-pay | Admitting: Family Medicine

## 2013-12-16 ENCOUNTER — Other Ambulatory Visit: Payer: Self-pay

## 2014-01-10 ENCOUNTER — Ambulatory Visit (INDEPENDENT_AMBULATORY_CARE_PROVIDER_SITE_OTHER): Payer: Medicare Other | Admitting: Family Medicine

## 2014-01-10 ENCOUNTER — Encounter: Payer: Self-pay | Admitting: Family Medicine

## 2014-01-10 VITALS — BP 138/82 | HR 45 | Wt 380.0 lb

## 2014-01-10 DIAGNOSIS — Z1211 Encounter for screening for malignant neoplasm of colon: Secondary | ICD-10-CM

## 2014-01-10 DIAGNOSIS — I10 Essential (primary) hypertension: Secondary | ICD-10-CM

## 2014-01-10 DIAGNOSIS — Z23 Encounter for immunization: Secondary | ICD-10-CM

## 2014-01-10 DIAGNOSIS — E785 Hyperlipidemia, unspecified: Secondary | ICD-10-CM

## 2014-01-10 DIAGNOSIS — E1169 Type 2 diabetes mellitus with other specified complication: Secondary | ICD-10-CM

## 2014-01-10 DIAGNOSIS — E119 Type 2 diabetes mellitus without complications: Secondary | ICD-10-CM

## 2014-01-10 DIAGNOSIS — E1159 Type 2 diabetes mellitus with other circulatory complications: Secondary | ICD-10-CM

## 2014-01-10 NOTE — Progress Notes (Signed)
   Subjective:    Patient ID: Marcia FieldLinda S Paul, female    DOB: 10-29-1945, 68 y.o.   MRN: [id number]  HPI She has been having difficulty over the last several months with constipation. She was seen in the emergency room for this. A CT scan was negative. She has not had a colonoscopy recently. She did use MiraLAX with good results. She continues on her blood pressure medication, statin and Janumet. She does periodically check her blood sugars. Her exercise is quite minimal. Her diet is unchanged. Smoking and drinking were reviewed. She does check her feet periodically and has had an eye exam within the last year.   Review of Systems     Objective:   Physical Exam Urgent and in no distress. Hemoglobin A1c is 7.3.       Assessment & Plan:  Special screening for malignant neoplasms, colon - Plan: Ambulatory referral to Gastroenterology  Type 2 diabetes mellitus without complication  Hypertension associated with diabetes  Obesity, morbid (more than 100 lbs over ideal weight or BMI > 40)  Hyperlipidemia associated with type 2 diabetes mellitus continue on her present regimen. Again stressed the need for her to make further diet and exercise changes. Follow-up in several months.

## 2014-01-10 NOTE — Patient Instructions (Signed)

## 2014-01-11 ENCOUNTER — Telehealth: Payer: Self-pay | Admitting: Family Medicine

## 2014-01-11 NOTE — Addendum Note (Signed)
Addended by: Herminio CommonsJOHNSON, Talaysha Freeberg A on: 01/11/2014 11:51 AM   Modules accepted: Orders, Medications

## 2014-01-11 NOTE — Telephone Encounter (Signed)
FYI  Pt called, she received call from [facility], they are not able to see her until 03/01/14. She does not want to wait that long to be seen. Contacted [facility] and scheduled appointment for 01/20/14 @ 9:00 with Celso Amyina Garrett, Physician Assistant for consult and to set up colonoscopy, they will schedule colonoscopy with the physician that is available when patient is available or patients preferred MD. Patient informed  Will fax records to [phone]

## 2014-02-01 ENCOUNTER — Encounter (HOSPITAL_COMMUNITY): Payer: Self-pay | Admitting: *Deleted

## 2014-02-07 ENCOUNTER — Other Ambulatory Visit: Payer: Self-pay | Admitting: Gastroenterology

## 2014-02-08 ENCOUNTER — Ambulatory Visit (HOSPITAL_COMMUNITY): Payer: Medicare Other | Admitting: Anesthesiology

## 2014-02-08 ENCOUNTER — Encounter (HOSPITAL_COMMUNITY)
Admission: RE | Disposition: A | Discharge status: Home / Self Care | Payer: Self-pay | Source: Ambulatory Visit | Attending: Gastroenterology

## 2014-02-08 ENCOUNTER — Encounter (HOSPITAL_COMMUNITY): Payer: Self-pay | Admitting: Anesthesiology

## 2014-02-08 ENCOUNTER — Ambulatory Visit (HOSPITAL_COMMUNITY)
Admission: RE | Admit: 2014-02-08 | Discharge: 2014-02-08 | Disposition: A | Discharge status: Home / Self Care | Payer: Medicare Other | Source: Ambulatory Visit | Attending: Gastroenterology | Admitting: Gastroenterology

## 2014-02-08 DIAGNOSIS — I10 Essential (primary) hypertension: Secondary | ICD-10-CM | POA: Diagnosis not present

## 2014-02-08 DIAGNOSIS — K5900 Constipation, unspecified: Secondary | ICD-10-CM | POA: Diagnosis not present

## 2014-02-08 DIAGNOSIS — K5730 Diverticulosis of large intestine without perforation or abscess without bleeding: Secondary | ICD-10-CM | POA: Diagnosis not present

## 2014-02-08 DIAGNOSIS — Z87891 Personal history of nicotine dependence: Secondary | ICD-10-CM | POA: Insufficient documentation

## 2014-02-08 DIAGNOSIS — Z7982 Long term (current) use of aspirin: Secondary | ICD-10-CM | POA: Insufficient documentation

## 2014-02-08 DIAGNOSIS — Z1211 Encounter for screening for malignant neoplasm of colon: Secondary | ICD-10-CM | POA: Insufficient documentation

## 2014-02-08 DIAGNOSIS — E785 Hyperlipidemia, unspecified: Secondary | ICD-10-CM | POA: Insufficient documentation

## 2014-02-08 DIAGNOSIS — E119 Type 2 diabetes mellitus without complications: Secondary | ICD-10-CM | POA: Diagnosis not present

## 2014-02-08 HISTORY — PX: COLONOSCOPY WITH PROPOFOL: SHX5780

## 2014-02-08 LAB — GLUCOSE, CAPILLARY: GLUCOSE-CAPILLARY: 160 mg/dL — AB (ref 70–99)

## 2014-02-08 LAB — HM COLONOSCOPY

## 2014-02-08 SURGERY — COLONOSCOPY WITH PROPOFOL
Anesthesia: Monitor Anesthesia Care

## 2014-02-08 MED ORDER — DIPHENHYDRAMINE HCL 50 MG/ML IJ SOLN
INTRAMUSCULAR | Status: AC
  Filled 2014-02-08: qty 1

## 2014-02-08 MED ORDER — PROPOFOL 10 MG/ML IV BOLUS
INTRAVENOUS | Status: AC
  Filled 2014-02-08: qty 20

## 2014-02-08 MED ORDER — PROPOFOL 10 MG/ML IV BOLUS
INTRAVENOUS | Status: DC | PRN
  Administered 2014-02-08: 50 mg via INTRAVENOUS
  Administered 2014-02-08 (×2): 100 mg via INTRAVENOUS
  Administered 2014-02-08 (×4): 50 mg via INTRAVENOUS
  Administered 2014-02-08: 100 mg via INTRAVENOUS
  Administered 2014-02-08: 50 mg via INTRAVENOUS
  Administered 2014-02-08: 100 mg via INTRAVENOUS

## 2014-02-08 MED ORDER — LACTATED RINGERS IV SOLN
INTRAVENOUS | Status: DC
  Administered 2014-02-08: 1000 mL via INTRAVENOUS

## 2014-02-08 MED ORDER — SODIUM CHLORIDE 0.9 % IV SOLN: INTRAVENOUS | Status: DC

## 2014-02-08 SURGICAL SUPPLY — 22 items

## 2014-02-08 NOTE — H&P (View-Only) (Signed)
   Subjective:    Patient ID: Marcia Paul, female    DOB: 12/20/1945, 68 y.o.   MRN: [id number]  HPI She has been having difficulty over the last several months with constipation. She was seen in the emergency room for this. A CT scan was negative. She has not had a colonoscopy recently. She did use MiraLAX with good results. She continues on her blood pressure medication, statin and Janumet. She does periodically check her blood sugars. Her exercise is quite minimal. Her diet is unchanged. Smoking and drinking were reviewed. She does check her feet periodically and has had an eye exam within the last year.   Review of Systems     Objective:   Physical Exam Urgent and in no distress. Hemoglobin A1c is 7.3.       Assessment & Plan:  Special screening for malignant neoplasms, colon - Plan: Ambulatory referral to Gastroenterology  Type 2 diabetes mellitus without complication  Hypertension associated with diabetes  Obesity, morbid (more than 100 lbs over ideal weight or BMI > 40)  Hyperlipidemia associated with type 2 diabetes mellitus continue on her present regimen. Again stressed the need for her to make further diet and exercise changes. Follow-up in several months.  

## 2014-02-08 NOTE — Op Note (Signed)
[facility] [address] [place], [id number]   COLONOSCOPY PROCEDURE REPORT  PATIENT: Marcia FieldChandler, Marcia S  MR#: [id number] BIRTHDATE: May 06, 1945 , 68  yrs. old GENDER: female ENDOSCOPIST: Willis ModenaWilliam Gorge Almanza, MD REFERRED VH:QIONBY:John Susann GivensLaLonde, M.D. PROCEDURE DATE:  02/08/2014 PROCEDURE:   Colonoscopy, screening ASA CLASS:   Class III INDICATIONS:average risk for colon cancer and last colonoscopy 2003.  MEDICATIONS: Monitored anesthesia care  DESCRIPTION OF PROCEDURE:   After the risks benefits and alternatives of the procedure were thoroughly explained, informed consent was obtained.  revealed no abnormalities of the rectum. The     endoscope was introduced through the anus and advanced to the cecum, which was identified by both the appendix and ileocecal valve. No adverse events experienced.   The quality of the prep was fair.  The instrument was then slowly withdrawn as the colon was fully examined.    Findings:  Digital rectal exam normal.  Prep quality fair; viscous stool obscured viewes in some areas; diminutive or subtle lesions could have been missed.  No polyps, masses, vascular ectasias, or inflammatory changes were seen.  Multiple small and large diverticula seen throughout the colon, but most prominently in the left colon.  Retroflexed view of rectum was normal. Withdrawal time was 11 minutes     .  The scope was withdrawn and the procedure completed.  COMPLICATIONS: None  ENDOSCOPIC IMPRESSION:     Extensive diverticulosis.  Otherwise normal, with some limitations as above from prep quality.   RECOMMENDATIONS:     1.  Watch for potential complications of procedure. 2.  High fiber diet. 3.  Continue Miralax for constipation. 3.  Repeat routine screening colonoscopy in 10 years, in absence of new/interval symptoms.  eSigned:  Willis ModenaWilliam Jiyah Torpey, MD 02/08/2014 10:03 AM   cc:  CPT CODES: ICD CODES:  The ICD and CPT codes recommended by this software  are interpretations from the data that the clinical staff has captured with the software.  The verification of the translation of this report to the ICD and CPT codes and modifiers is the sole responsibility of the health care institution and practicing physician where this report was generated.  [employer] will not be held responsible for the validity of the ICD and CPT codes included on this report.  AMA assumes no liability for data contained or not contained herein. CPT is a Publishing rights managerregistered trademark of the [employer].

## 2014-02-08 NOTE — Anesthesia Postprocedure Evaluation (Signed)
  Anesthesia Post-op Note  Patient: Marcia Paul  Procedure(s) Performed: Procedure(s) (LRB): COLONOSCOPY WITH PROPOFOL (N/A)  Patient Location: PACU  Anesthesia Type: MAC  Level of Consciousness: awake and alert   Airway and Oxygen Therapy: Patient Spontanous Breathing  Post-op Pain: mild  Post-op Assessment: Post-op Vital signs reviewed, Patient's Cardiovascular Status Stable, Respiratory Function Stable, Patent Airway and No signs of Nausea or vomiting  Last Vitals:  126/91, 75, 19  Post-op Vital Signs: stable   Complications: No apparent anesthesia complications

## 2014-02-08 NOTE — Discharge Instructions (Signed)
Colonoscopy ° °Post procedure instructions: ° °Read the instructions outlined below and refer to this sheet in the next few weeks. These discharge instructions provide you with general information on caring for yourself after you leave the hospital. Your doctor may also give you specific instructions. While your treatment has been planned according to the most current medical practices available, unavoidable complications occasionally occur. If you have any problems or questions after discharge, call Dr. November Sypher at [facility] ([phone]). ° °HOME CARE INSTRUCTIONS ° °ACTIVITY: °· You may resume your regular activity, but move at a slower pace for the next 24 hours.  °· Take frequent rest periods for the next 24 hours.  °· Walking will help get rid of the air and reduce the bloated feeling in your belly (abdomen).  °· No driving for 24 hours (because of the medicine (anesthesia) used during the test).  °· You may shower.  °· Do not sign any important legal documents or operate any machinery for 24 hours (because of the anesthesia used during the test).  °NUTRITION: °· Drink plenty of fluids.  °· You may resume your normal diet as instructed by your doctor.  °· Begin with a light meal and progress to your normal diet. Heavy or fried foods are harder to digest and may make you feel sick to your stomach (nauseated).  °· Avoid alcoholic beverages for 24 hours or as instructed.  °MEDICATIONS: °· You may resume your normal medications unless your doctor tells you otherwise.  °WHAT TO EXPECT TODAY: °· Some feelings of bloating in the abdomen.  °· Passage of more gas than usual.  °· Spotting of blood in your stool or on the toilet paper.  °IF YOU HAD POLYPS REMOVED DURING THE COLONOSCOPY: °· No aspirin products for 7 days or as instructed.  °· No alcohol for 7 days or as instructed.  °· Eat a soft diet for the next 24 hours.  ° °FINDING OUT THE RESULTS OF YOUR TEST ° °Not all test results are available during your  visit. If your test results are not back during the visit, make an appointment with your caregiver to find out the results. Do not assume everything is normal if you have not heard from your caregiver or the medical facility. It is important for you to follow up on all of your test results.  ° ° ° °SEEK IMMEDIATE MEDICAL CARE IF: ° °· You have more than a spotting of blood in your stool.  °· Your belly is swollen (abdominal distention).  °· You are nauseated or vomiting.  °· You have a fever.  °· You have abdominal pain or discomfort that is severe or gets worse throughout the day.  ° ° °Document Released: 10/02/2003 Document Revised: 10/30/2010 Document Reviewed: 09/30/2007 °ExitCare® Patient Information ©2012 [employer] LLC. ° °

## 2014-02-08 NOTE — Anesthesia Preprocedure Evaluation (Signed)
Anesthesia Evaluation  Patient identified by MRN, date of birth, ID band Patient awake    Reviewed: Allergy & Precautions, H&P , NPO status , Patient's Chart, lab work & pertinent test results  Airway Mallampati: II  TM Distance: >3 FB Neck ROM: Full    Dental no notable dental hx.    Pulmonary former smoker,  breath sounds clear to auscultation  Pulmonary exam normal       Cardiovascular hypertension, Pt. on medications negative cardio ROS  Rhythm:Regular Rate:Normal     Neuro/Psych negative neurological ROS  negative psych ROS   GI/Hepatic negative GI ROS, Neg liver ROS,   Endo/Other  diabetes, Type 2, Oral Hypoglycemic Agents  Renal/GU negative Renal ROS  negative genitourinary   Musculoskeletal negative musculoskeletal ROS (+)   Abdominal (+) + obese,   Peds negative pediatric ROS (+)  Hematology negative hematology ROS (+)   Anesthesia Other Findings   Reproductive/Obstetrics negative OB ROS                             Anesthesia Physical Anesthesia Plan  ASA: III  Anesthesia Plan: MAC   Post-op Pain Management:    Induction: Intravenous  Airway Management Planned:   Additional Equipment:   Intra-op Plan:   Post-operative Plan:   Informed Consent: I have reviewed the patients History and Physical, chart, labs and discussed the procedure including the risks, benefits and alternatives for the proposed anesthesia with the patient or authorized representative who has indicated his/her understanding and acceptance.   Dental advisory given  Plan Discussed with: CRNA  Anesthesia Plan Comments:         Anesthesia Quick Evaluation

## 2014-02-08 NOTE — Progress Notes (Signed)
Some itching and mild rash both arms, given diphenhydramine.  Neither CRNA nor I am convinced this is propofol-related.  No medication except propofol was administered during the case.

## 2014-02-08 NOTE — Transfer of Care (Signed)
Immediate Anesthesia Transfer of Care Note  Patient: Marcia Paul  Procedure(s) Performed: Procedure(s) (LRB): COLONOSCOPY WITH PROPOFOL (N/A)  Patient Location: PACU  Anesthesia Type: MAC  Level of Consciousness: sedated, patient cooperative and responds to stimulation  Airway & Oxygen Therapy: Patient Spontanous Breathing and Patient connected to face mask oxgen  Post-op Assessment: Report given to PACU RN and Post -op Vital signs reviewed and stable  Post vital signs: Reviewed and stable  Complications: No apparent anesthesia complications

## 2014-02-08 NOTE — Addendum Note (Signed)
Addended by: Neytiri Asche on: 02/08/2014 07:58 AM   Modules accepted: Orders  

## 2014-02-08 NOTE — Interval H&P Note (Signed)
History and Physical Interval Note:  02/08/2014 8:47 AM  Marcia Paul  has presented today for surgery, with the diagnosis of constipation  The various methods of treatment have been discussed with the patient and family. After consideration of risks, benefits and other options for treatment, the patient has consented to  Procedure(s): COLONOSCOPY WITH PROPOFOL (N/A) as a surgical intervention .  The patient's history has been reviewed, patient examined, no change in status, stable for surgery.  I have reviewed the patient's chart and labs.  Questions were answered to the patient's satisfaction.     [place] M  Assessment:  1.  Chronic constipation. 2.  Average-risk for colon cancer screening.  Plan:  1.  Colonoscopy. 2.  Risks (bleeding, infection, bowel perforation that could require surgery, sedation-related changes in cardiopulmonary systems), benefits (identification and possible treatment of source of symptoms, exclusion of certain causes of symptoms), and alternatives (watchful waiting, radiographic imaging studies, empiric medical treatment) of colonoscopy were explained to patient/family in detail and patient wishes to proceed.

## 2014-02-09 ENCOUNTER — Encounter (HOSPITAL_COMMUNITY): Payer: Self-pay | Admitting: Gastroenterology

## 2014-02-20 ENCOUNTER — Other Ambulatory Visit: Payer: Self-pay | Admitting: Family Medicine

## 2014-02-28 ENCOUNTER — Encounter: Payer: Self-pay | Admitting: Internal Medicine

## 2014-02-28 ENCOUNTER — Other Ambulatory Visit: Payer: Self-pay | Admitting: Family Medicine

## 2014-04-25 ENCOUNTER — Other Ambulatory Visit: Payer: Self-pay | Admitting: Family Medicine

## 2014-05-16 ENCOUNTER — Ambulatory Visit (INDEPENDENT_AMBULATORY_CARE_PROVIDER_SITE_OTHER): Payer: Medicare Other | Admitting: Family Medicine

## 2014-05-16 ENCOUNTER — Encounter: Payer: Self-pay | Admitting: Family Medicine

## 2014-05-16 VITALS — BP 126/80 | HR 84 | Wt 399.0 lb

## 2014-05-16 DIAGNOSIS — I152 Hypertension secondary to endocrine disorders: Secondary | ICD-10-CM

## 2014-05-16 DIAGNOSIS — E1169 Type 2 diabetes mellitus with other specified complication: Secondary | ICD-10-CM | POA: Diagnosis not present

## 2014-05-16 DIAGNOSIS — E785 Hyperlipidemia, unspecified: Secondary | ICD-10-CM

## 2014-05-16 DIAGNOSIS — E119 Type 2 diabetes mellitus without complications: Secondary | ICD-10-CM | POA: Diagnosis not present

## 2014-05-16 DIAGNOSIS — I10 Essential (primary) hypertension: Secondary | ICD-10-CM | POA: Diagnosis not present

## 2014-05-16 DIAGNOSIS — E1159 Type 2 diabetes mellitus with other circulatory complications: Secondary | ICD-10-CM

## 2014-05-16 LAB — POCT GLYCOSYLATED HEMOGLOBIN (HGB A1C): Hemoglobin A1C: 7.3

## 2014-05-16 NOTE — Patient Instructions (Signed)
Permanent lifestyle change. Cut back on carbs. Walk or something physical 150 minutes a week

## 2014-05-16 NOTE — Progress Notes (Signed)
  Subjective:    Patient ID: Marcia Paul, female    DOB: April 14, 1945, 69 y.o.   MRN: [id number]  Marcia Paul is a 69 y.o. female who presents for follow-up of Type 2 diabetes mellitus.  Home blood sugar records: Patient checks one to two times a day Current symptoms/problems none Daily foot checks:   Any foot concerns: none Exercise: walking Again. She did not walk much over the winter months. She has not changed any of her eating habits. EYE:05/16/13 The following portions of the patient's history were reviewed and updated as appropriate: allergies, current medications, past medical history, past social history and problem list.  ROS as in subjective above.     Objective:    Physical Exam Alert and in no distress otherwise not examined.   Lab Review Diabetic Labs Latest Ref Rng 09/18/2013 08/11/2013 04/15/2013 04/14/2013 11/23/2012  HbA1c - - 7.1 7.2 - 6.5  Chol 0 - 200 mg/dL - 440146 - - -  HDL >10>39 mg/dL - 54 - - -  Calc LDL 0 - 99 mg/dL - 71 - - -  Triglycerides <150 mg/dL - 272105 - - -  Creatinine 0.50 - 1.10 mg/dL 5.360.90 - - 6.440.79 -   BP/Weight 02/08/2014 01/10/2014 09/19/2013 08/11/2013 04/14/2013  Systolic BP 126 138 133 110 120  Diastolic BP 91 82 82 90 82  Wt. (Lbs) - 380 - 367 387  BMI - 54.52 - 52.66 55.53   Foot/eye exam completion dates Latest Ref Rng 05/16/2013  Eye Exam No Retinopathy No Retinopathy  Foot Form Completion - -    Marcia QuinLinda  reports that she quit smoking about 3 months ago. Her smoking use included Cigarettes. She smoked 1.00 pack per day. She does not have any smokeless tobacco history on file. She reports that she drinks alcohol. She reports that she does not use illicit drugs. Hemoglobin A1c is 7.3    Assessment & Plan:    No diagnosis found.  1. Rx changes: none 2. Education: Reviewed 'ABCs' of diabetes management (respective goals in parentheses):  A1C (<7), blood pressure (<130/80), and cholesterol (LDL <100). 3. Compliance at present is estimated  to be fair. Efforts to improve compliance (if necessary) will be directed at increased exercise. 4. Follow up: 4 months  5. I again stressed the need for her to make permanent lifestyle changes in regard to eating habits and her physical activities. Discussed walking for 5 or 10 minutes at a time rather than taking a larger chunk of time out of her day.

## 2014-06-06 ENCOUNTER — Other Ambulatory Visit: Payer: Self-pay | Admitting: Family Medicine

## 2014-06-26 ENCOUNTER — Telehealth: Payer: Self-pay | Admitting: Family Medicine

## 2014-06-26 NOTE — Telephone Encounter (Signed)
Patient called because she noticed in "My Chart" that her Benicar HCT is listed as generic and she cant take the generic of that (causes migraines and bad cough) She wants to make sure it gets noted correctly on her chart. Looks like [facility], may have entered when she went for procedure

## 2014-06-26 NOTE — Telephone Encounter (Signed)
See previous message from today, she also wants to make sure it is documented that she gets a 90 day supply

## 2014-08-20 ENCOUNTER — Other Ambulatory Visit: Payer: Self-pay | Admitting: Medical

## 2014-08-28 ENCOUNTER — Other Ambulatory Visit: Payer: Self-pay

## 2014-09-12 ENCOUNTER — Other Ambulatory Visit: Payer: Self-pay | Admitting: Family Medicine

## 2014-09-19 ENCOUNTER — Ambulatory Visit: Payer: Medicare Other | Admitting: Family Medicine

## 2014-10-09 ENCOUNTER — Other Ambulatory Visit: Payer: Self-pay | Admitting: Family Medicine

## 2014-10-17 ENCOUNTER — Encounter: Payer: Self-pay | Admitting: Family Medicine

## 2014-10-17 ENCOUNTER — Ambulatory Visit (INDEPENDENT_AMBULATORY_CARE_PROVIDER_SITE_OTHER): Payer: Medicare Other | Admitting: Family Medicine

## 2014-10-17 VITALS — BP 120/80 | HR 68 | Wt >= 6400 oz

## 2014-10-17 DIAGNOSIS — E1169 Type 2 diabetes mellitus with other specified complication: Secondary | ICD-10-CM

## 2014-10-17 DIAGNOSIS — E1159 Type 2 diabetes mellitus with other circulatory complications: Secondary | ICD-10-CM

## 2014-10-17 DIAGNOSIS — E1136 Type 2 diabetes mellitus with diabetic cataract: Secondary | ICD-10-CM

## 2014-10-17 DIAGNOSIS — I10 Essential (primary) hypertension: Secondary | ICD-10-CM

## 2014-10-17 DIAGNOSIS — E785 Hyperlipidemia, unspecified: Secondary | ICD-10-CM

## 2014-10-17 DIAGNOSIS — E119 Type 2 diabetes mellitus without complications: Secondary | ICD-10-CM

## 2014-10-17 LAB — POCT GLYCOSYLATED HEMOGLOBIN (HGB A1C): Hemoglobin A1C: 7.9

## 2014-10-17 NOTE — Progress Notes (Signed)
Subjective:    Patient ID: Marcia Paul, female    DOB: Jan 27, 1946, 69 y.o.   MRN: [id number]  Marcia Paul is a 69 y.o. female who presents for follow-up of Type 2 diabetes mellitus.  Home blood sugar Patient test BID some time TID a day.She and her husband have been making dietary changes for the last month. Current symptoms/problems Sugar level has been high in mornings also she has been sick with her teeth and has been under the care of a dentist. Daily foot checks: yes   Any foot concerns:  none  Exercise: walking; retired for 4 weeks she has been walking 20-30 minutes every day. Eye:05/2013 She did quit smoking several years ago. She is having no cognitive difficulties or evidence of depression. She's had no difficulty with exertional type leg pain.Her last mammogram was 2 years ago. The following portions of the patient's history were reviewed and updated as appropriate: allergies, current medications, past medical history, past social history and problem list.She retired one month ago. Does have a living will. ROS as in subjective above.     Objective:    Physical Exam Alert and in no distress otherwise not examined.   Lab Review Diabetic Labs Latest Ref Rng 05/16/2014 09/18/2013 08/11/2013 04/15/2013 04/14/2013  HbA1c - 7.3 - 7.1 7.2 -  Chol 0 - 200 mg/dL - - 696 - -  HDL >29 mg/dL - - 54 - -  Calc LDL 0 - 99 mg/dL - - 71 - -  Triglycerides <150 mg/dL - - 528 - -  Creatinine 0.50 - 1.10 mg/dL - 4.13 - - 2.44   BP/Weight 05/16/2014 02/08/2014 01/10/2014 09/19/2013 08/11/2013  Systolic BP 126 126 138 133 110  Diastolic BP 80 91 82 82 90  Wt. (Lbs) 399 - 380 - 367  BMI 57.25 - 54.52 - 52.66   Foot/eye exam completion dates Latest Ref Rng 05/16/2013  Eye Exam No Retinopathy No Retinopathy  Foot Form Completion - -   HbA1C1.9  Marcia Paul  reports that she quit smoking about 8 months ago. Her smoking use included Cigarettes. She smoked 1.00 pack per day. She does not have any  smokeless tobacco history on file. She reports that she drinks alcohol. She reports that she does not use illicit drugs.     Assessment & Plan:    Type 2 diabetes mellitus without complication - Plan: POCT glycosylated hemoglobin (Hb A1C), CBC with Differential/Platelet, Comprehensive metabolic panel, Lipid panel, POCT UA - Microalbumin  Obesity, morbid (more than 100 lbs over ideal weight or BMI > 40) - Plan: CBC with Differential/Platelet, Comprehensive metabolic panel, Lipid panel  Hypertension associated with diabetes - Plan: CBC with Differential/Platelet, Comprehensive metabolic panel  Hyperlipidemia associated with type 2 diabetes mellitus - Plan: CBC with Differential/Platelet, Lipid panel  Diabetic cataract, associated with type 2 diabetes mellitus - Plan: CBC with Differential/Platelet   1. Rx changes: none 2. Education: Reviewed 'ABCs' of diabetes management (respective goals in parentheses):  A1C (<7), blood pressure (<130/80), and cholesterol (LDL <100). 3. Compliance at present is estimated to be good. Efforts to improve compliance (if necessary) will be directed at continue with present diet and exercise regimen. 4. Follow up: 4 months She will set up for a mammogram as well as an eye examination. Encouraged her to continue with her present diet and exercise regimen. Discussed follow-up in 4 months. Warned that this new lifestyle regimen might not result in weight reduction. Discussed the benefits of continuing this to  build up her strength and stamina. Follow-up here in 4 months.

## 2014-10-18 ENCOUNTER — Other Ambulatory Visit: Payer: Self-pay

## 2014-10-18 DIAGNOSIS — Z1231 Encounter for screening mammogram for malignant neoplasm of breast: Secondary | ICD-10-CM

## 2014-10-18 LAB — CBC WITH DIFFERENTIAL/PLATELET
Basophils Absolute: 0 10*3/uL (ref 0.0–0.1)
Basophils Relative: 0 % (ref 0–1)
Eosinophils Absolute: 0.2 10*3/uL (ref 0.0–0.7)
Eosinophils Relative: 2 % (ref 0–5)
HCT: 40.1 % (ref 36.0–46.0)
HEMOGLOBIN: 13.1 g/dL (ref 12.0–15.0)
LYMPHS ABS: 2 10*3/uL (ref 0.7–4.0)
Lymphocytes Relative: 23 % (ref 12–46)
MCH: 28.3 pg (ref 26.0–34.0)
MCHC: 32.7 g/dL (ref 30.0–36.0)
MCV: 86.6 fL (ref 78.0–100.0)
MONOS PCT: 7 % (ref 3–12)
MPV: 9.8 fL (ref 8.6–12.4)
Monocytes Absolute: 0.6 10*3/uL (ref 0.1–1.0)
NEUTROS ABS: 6 10*3/uL (ref 1.7–7.7)
NEUTROS PCT: 68 % (ref 43–77)
Platelets: 274 10*3/uL (ref 150–400)
RBC: 4.63 MIL/uL (ref 3.87–5.11)
RDW: 14.1 % (ref 11.5–15.5)
WBC: 8.8 10*3/uL (ref 4.0–10.5)

## 2014-10-18 LAB — LIPID PANEL
CHOL/HDL RATIO: 2.2 ratio (ref <=5.0)
CHOLESTEROL: 137 mg/dL (ref 125–200)
HDL: 61 mg/dL (ref >=46)
LDL Cholesterol: 56 mg/dL (ref <130)
Triglycerides: 100 mg/dL (ref <150)
VLDL: 20 mg/dL (ref <30)

## 2014-10-18 LAB — COMPREHENSIVE METABOLIC PANEL
ALT: 29 U/L (ref 6–29)
AST: 35 U/L (ref 10–35)
Albumin: 4.2 g/dL (ref 3.6–5.1)
Alkaline Phosphatase: 101 U/L (ref 33–130)
BUN: 18 mg/dL (ref 7–25)
CHLORIDE: 101 mmol/L (ref 98–110)
CO2: 27 mmol/L (ref 20–31)
CREATININE: 0.75 mg/dL (ref 0.50–0.99)
Calcium: 10.1 mg/dL (ref 8.6–10.4)
Glucose, Bld: 148 mg/dL — ABNORMAL HIGH (ref 65–99)
POTASSIUM: 4.1 mmol/L (ref 3.5–5.3)
Sodium: 137 mmol/L (ref 135–146)
TOTAL PROTEIN: 6.9 g/dL (ref 6.1–8.1)
Total Bilirubin: 0.6 mg/dL (ref 0.2–1.2)

## 2014-10-19 ENCOUNTER — Ambulatory Visit
Admission: RE | Admit: 2014-10-19 | Discharge: 2014-10-19 | Disposition: A | Discharge status: Home / Self Care | Payer: Medicare Other | Source: Ambulatory Visit

## 2014-10-19 DIAGNOSIS — Z1231 Encounter for screening mammogram for malignant neoplasm of breast: Secondary | ICD-10-CM

## 2014-10-19 LAB — HM DIABETES EYE EXAM

## 2014-10-24 ENCOUNTER — Other Ambulatory Visit: Payer: Self-pay | Admitting: Family Medicine

## 2014-10-24 DIAGNOSIS — R928 Other abnormal and inconclusive findings on diagnostic imaging of breast: Secondary | ICD-10-CM

## 2014-10-27 ENCOUNTER — Ambulatory Visit
Admission: RE | Admit: 2014-10-27 | Discharge: 2014-10-27 | Disposition: A | Discharge status: Home / Self Care | Payer: Medicare Other | Source: Ambulatory Visit | Attending: Family Medicine | Admitting: Family Medicine

## 2014-10-27 DIAGNOSIS — R928 Other abnormal and inconclusive findings on diagnostic imaging of breast: Secondary | ICD-10-CM

## 2014-10-31 IMAGING — CT CT ABD-PELV W/ CM
1 of 3 series · 14 of 32 positions shown, 19 images · IV contrast (OMNIPAQUE 300)
Comparison: CT of the abdomen and pelvis performed 06/27/2005

CLINICAL DATA: Diffuse abdominal pain and weakness.

EXAM:
CT ABDOMEN AND PELVIS WITH CONTRAST
TECHNIQUE: Multidetector CT imaging of the abdomen and pelvis was performed
using the standard protocol following bolus administration of
intravenous contrast.
CONTRAST:  125mL OMNIPAQUE IOHEXOL 300 MG/ML  SOLN

[Series 2: abd/pel with · axial · 0.95mm/px · z∈[-344,+61]mm · 14 of 93 slices shown, 19 images]
[im 6/93  soft-tissue]
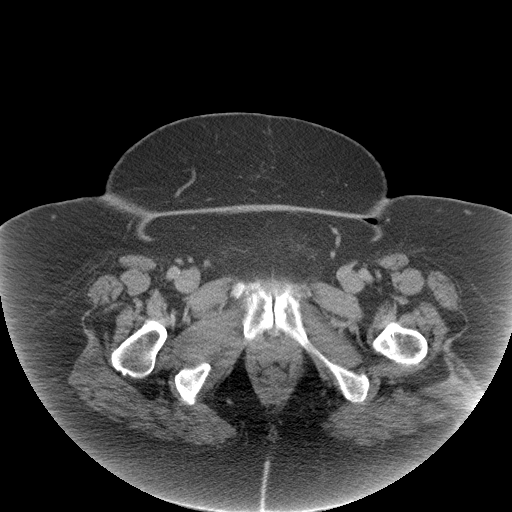
[im 6/93  bone]
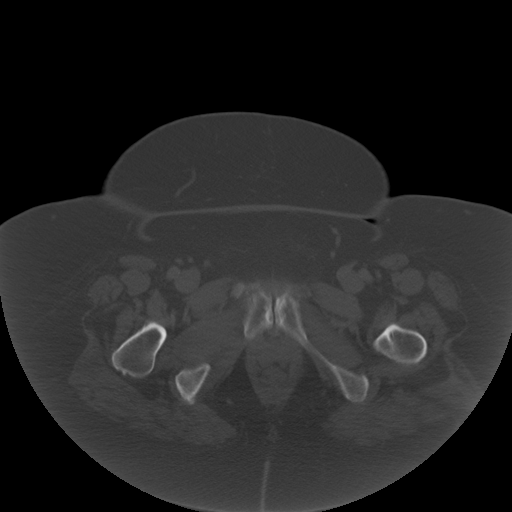
[im 11/93  soft-tissue]
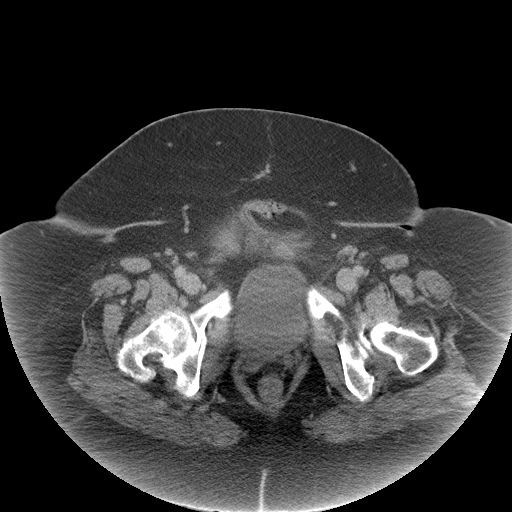
[im 21/93  soft-tissue]
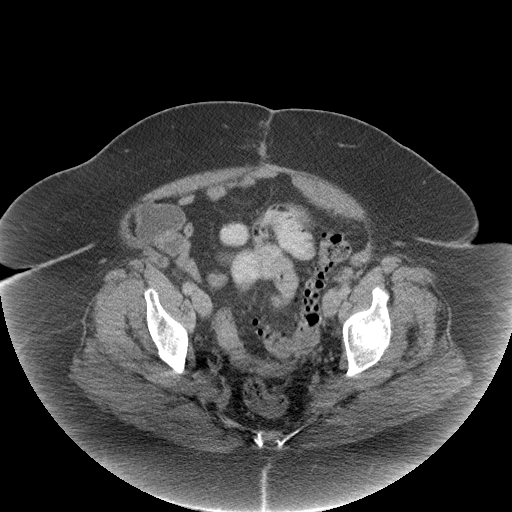
[im 26/93  soft-tissue]
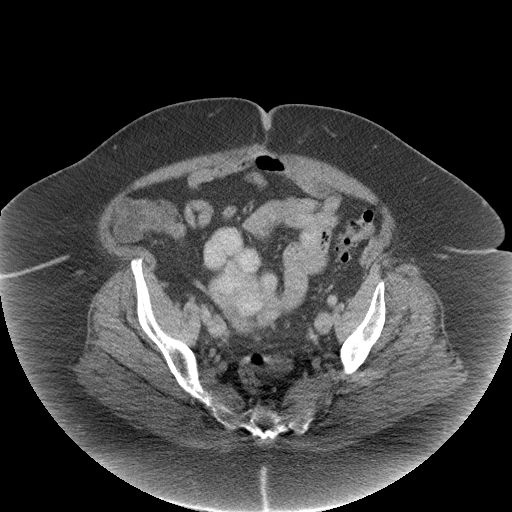
[im 31/93  soft-tissue]
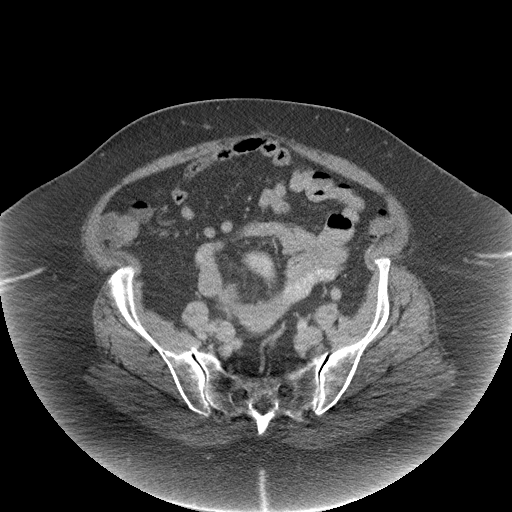
[im 41/93  soft-tissue]
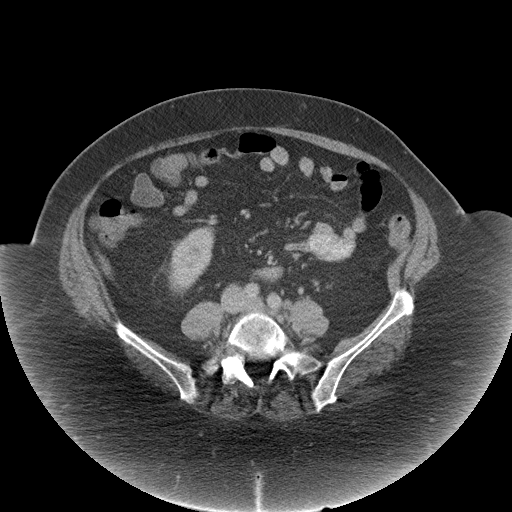
[im 47/93  soft-tissue]
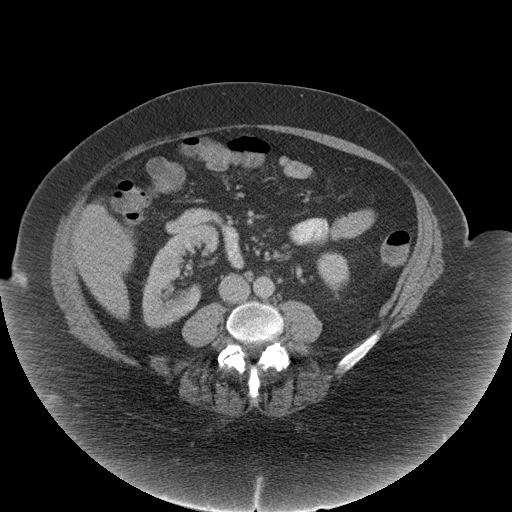
[im 52/93  soft-tissue]
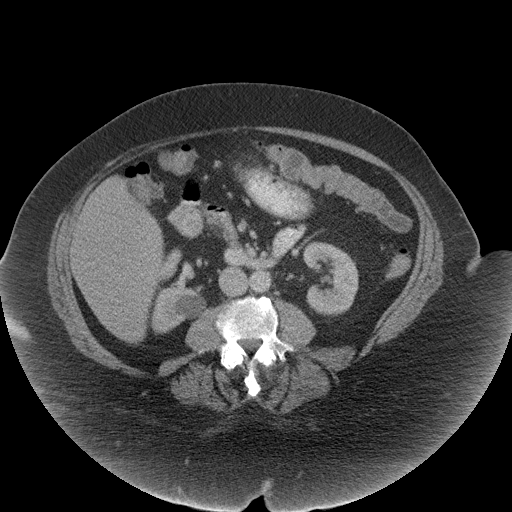
[im 62/93  soft-tissue]
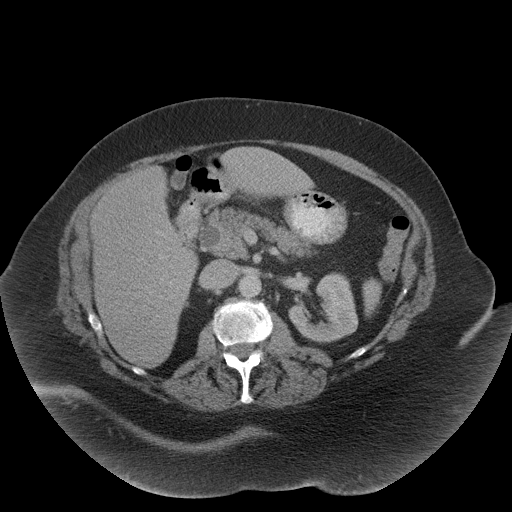
[im 62/93  bone]
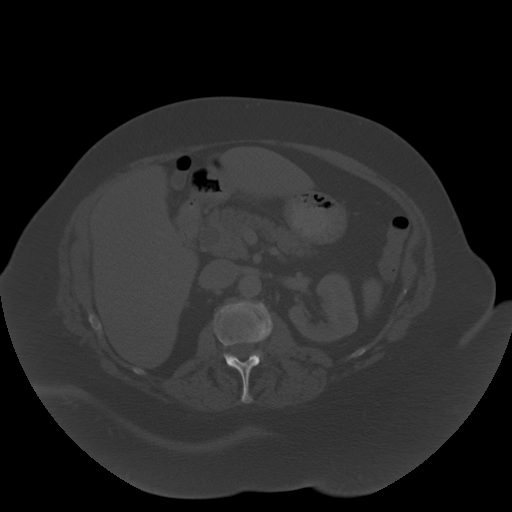
[im 67/93  soft-tissue]
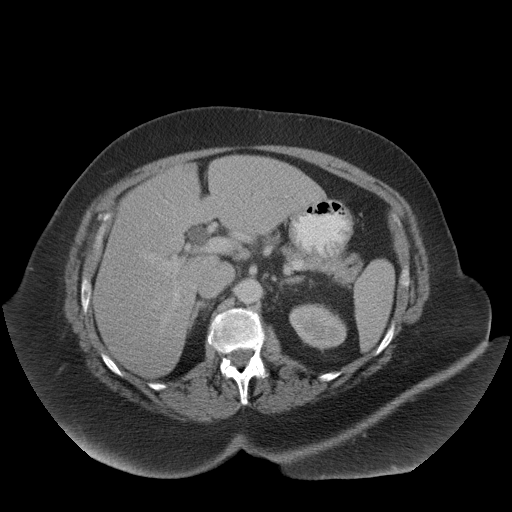
[im 72/93  soft-tissue]
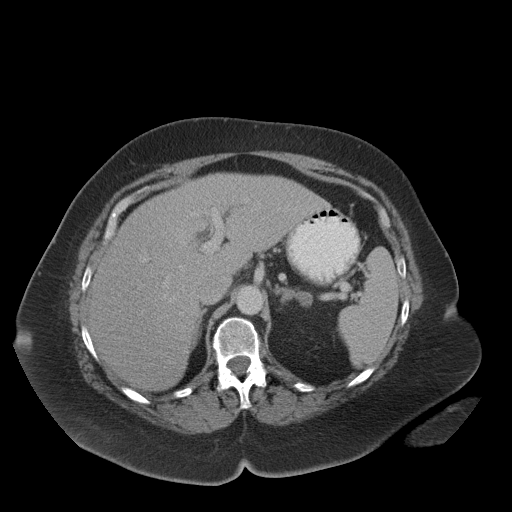
[im 72/93  lung]
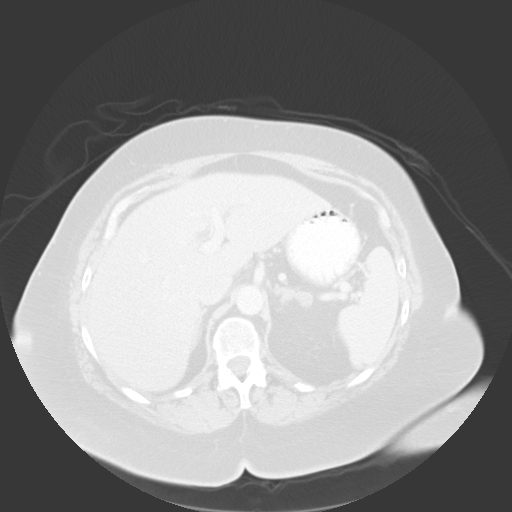
[im 77/93  lung]
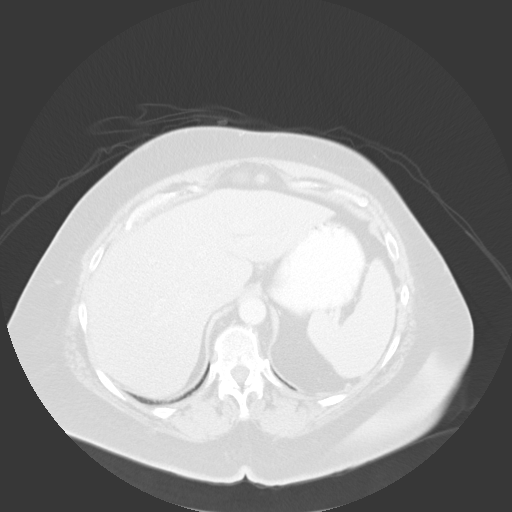
[im 82/93  soft-tissue]
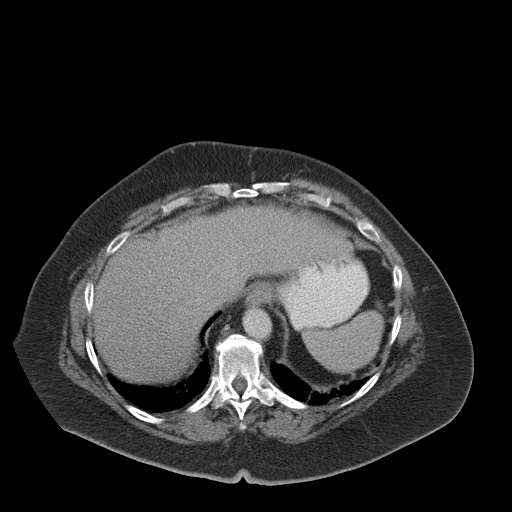
[im 82/93  lung]
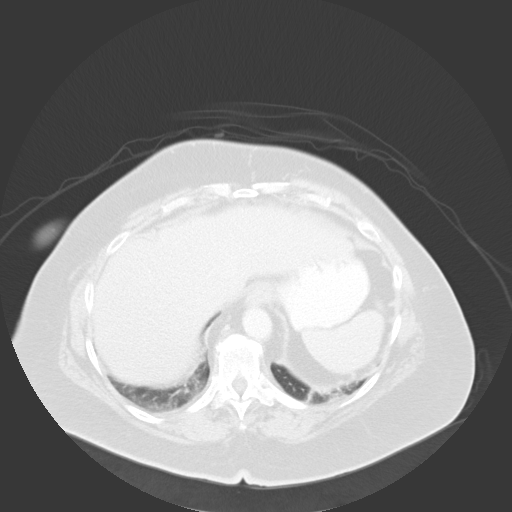
[im 87/93  soft-tissue]
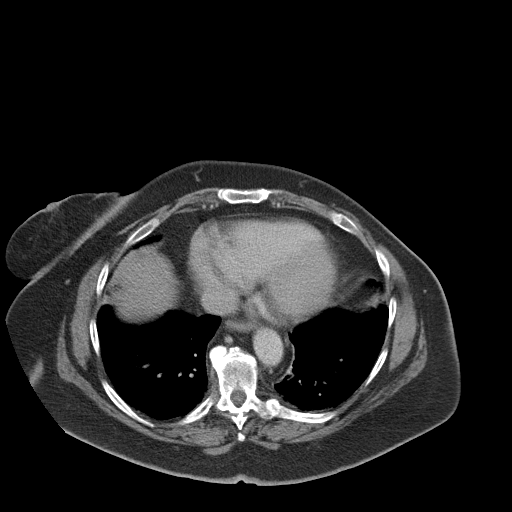
[im 87/93  lung]
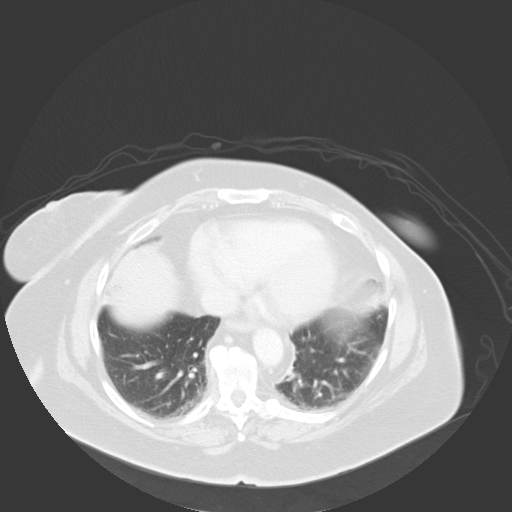

[14 of 32 positions shown; findings below may reference images not displayed]

FINDINGS: Minimal bibasilar atelectasis is noted.

The patient is status post cholecystectomy. Minimal prominence of
the intrahepatic biliary ducts remains within normal limits status
post cholecystectomy. The liver is otherwise unremarkable. The
spleen is within normal limits. The pancreas and adrenal glands are
unremarkable.

A 2.9 cm hypodensity is noted at the posterior aspect of the right
kidney. Minimal nonspecific perinephric stranding is noted
bilaterally. There is incomplete rotation of the right kidney. The
kidneys are otherwise unremarkable in appearance. There is no
evidence of hydronephrosis. No renal or ureteral stones are seen.

No free fluid is identified. The small bowel is unremarkable in
appearance. The stomach is within normal limits. No acute vascular
abnormalities are seen.

The appendix is not definitely seen; there is no evidence for
appendicitis. Diverticulosis is noted along the descending and
sigmoid colon, without evidence of diverticulitis.

The bladder is mildly distended and grossly unremarkable. The
patient is status post hysterectomy. No suspicious adnexal masses
are seen. No inguinal lymphadenopathy is seen.

No acute osseous abnormalities are identified. Facet disease is
noted at the lower lumbar spine.
IMPRESSION: 1. No acute abnormality seen within the abdomen or pelvis.
2. Diverticulosis along the descending and sigmoid colon, without
evidence of diverticulitis.
3. Right renal cyst noted.

## 2014-11-22 ENCOUNTER — Other Ambulatory Visit (INDEPENDENT_AMBULATORY_CARE_PROVIDER_SITE_OTHER): Payer: Medicare Other

## 2014-11-22 DIAGNOSIS — Z23 Encounter for immunization: Secondary | ICD-10-CM

## 2015-02-06 ENCOUNTER — Other Ambulatory Visit: Payer: Self-pay | Admitting: Family Medicine

## 2015-02-06 MED ORDER — POLYETHYLENE GLYCOL 3350 17 G PO PACK: 17.0000 g | PACK | Freq: Two times a day (BID) | ORAL | Status: DC

## 2015-02-06 NOTE — Telephone Encounter (Signed)
Needs refill on miralax, she is still taking twice a day. Would like to do 90 day supplies at a time  She originally got this from GI but she said she asked you about continuing it and you told her that was ok. She would like to keep all her Rx's thru you, especiallly since she doesn't plan to go back to GI    [facility]

## 2015-02-20 ENCOUNTER — Ambulatory Visit: Payer: Medicare Other | Admitting: Family Medicine

## 2015-02-24 ENCOUNTER — Other Ambulatory Visit: Payer: Self-pay | Admitting: Medical

## 2015-03-22 ENCOUNTER — Telehealth: Payer: Self-pay | Admitting: Family Medicine

## 2015-03-22 ENCOUNTER — Ambulatory Visit (INDEPENDENT_AMBULATORY_CARE_PROVIDER_SITE_OTHER): Payer: Medicare Other | Admitting: Family Medicine

## 2015-03-22 ENCOUNTER — Encounter: Payer: Self-pay | Admitting: Family Medicine

## 2015-03-22 ENCOUNTER — Other Ambulatory Visit: Payer: Self-pay | Admitting: Family Medicine

## 2015-03-22 VITALS — BP 120/78 | HR 80 | Temp 98.4°F | Wt >= 6400 oz

## 2015-03-22 DIAGNOSIS — E119 Type 2 diabetes mellitus without complications: Secondary | ICD-10-CM | POA: Diagnosis not present

## 2015-03-22 DIAGNOSIS — E1169 Type 2 diabetes mellitus with other specified complication: Secondary | ICD-10-CM | POA: Diagnosis not present

## 2015-03-22 DIAGNOSIS — E1136 Type 2 diabetes mellitus with diabetic cataract: Secondary | ICD-10-CM

## 2015-03-22 DIAGNOSIS — E118 Type 2 diabetes mellitus with unspecified complications: Secondary | ICD-10-CM

## 2015-03-22 DIAGNOSIS — I152 Hypertension secondary to endocrine disorders: Secondary | ICD-10-CM

## 2015-03-22 DIAGNOSIS — E1159 Type 2 diabetes mellitus with other circulatory complications: Secondary | ICD-10-CM

## 2015-03-22 DIAGNOSIS — N6489 Other specified disorders of breast: Secondary | ICD-10-CM

## 2015-03-22 DIAGNOSIS — E785 Hyperlipidemia, unspecified: Secondary | ICD-10-CM | POA: Diagnosis not present

## 2015-03-22 DIAGNOSIS — G5601 Carpal tunnel syndrome, right upper limb: Secondary | ICD-10-CM

## 2015-03-22 DIAGNOSIS — I10 Essential (primary) hypertension: Secondary | ICD-10-CM | POA: Diagnosis not present

## 2015-03-22 DIAGNOSIS — J069 Acute upper respiratory infection, unspecified: Secondary | ICD-10-CM | POA: Diagnosis not present

## 2015-03-22 LAB — POCT GLYCOSYLATED HEMOGLOBIN (HGB A1C): HEMOGLOBIN A1C: 9.2

## 2015-03-22 MED ORDER — INSULIN DETEMIR 100 UNIT/ML ~~LOC~~ SOLN: 10.0000 [IU] | Freq: Every day | SUBCUTANEOUS | Status: DC

## 2015-03-22 NOTE — Telephone Encounter (Signed)
Pt informed

## 2015-03-22 NOTE — Patient Instructions (Signed)
Give yourself shot every morning and check your blood sugar every morning. Sending a message on my chart in one week with your blood sugar readings. I'm going to try and get your sugars down to 120 or less in the mornings

## 2015-03-22 NOTE — Telephone Encounter (Signed)
Let her know it can be taken at either time. Whatever is convenient for her

## 2015-03-22 NOTE — Telephone Encounter (Signed)
Pt wants clarification on when to take insulin. AVS says to take in morning and instructions on insulin says to take at bedtime at night.

## 2015-03-22 NOTE — Progress Notes (Signed)
  Subjective:   Marcia Paul is an 70 y.o. female who presents for follow up of Type 2 diabetes mellitus.  She has had a lot of dental work done over the last several months as well as having infections from that and and into the sinus because the dental work. This has occurred over the last 2-1/2-3 months and has deftly effective blood sugars going into the 200 range. She did not call me concerning this. Presently she has a two-week history of nasal congestion, this, malaise and starting to slightly fill better. She does have a slight cough. She also has a 2 month history of numbness to the right index and thumb. Prior to this it was intermittent in nature and now has become constant. Patient is checking home blood sugars.   Home blood sugar records: BGs range between 200 and 200 Current symptoms include: none. Patient denies foot ulcerations.  Patient is checking their feet daily. Foot concerns (callous, ulcer, wound, thickened nails, toenail fungus, skin fungus, hammer toe): none Last dilated eye exam several months.  Current treatments: continue current treatments and I will add Levemir. Medication compliance: good  Current diet: in general, a "healthy" diet   Current exercise: none Known diabetic complications: retinopathy   The following portions of the patient's history were reviewed and updated as appropriate: allergies, current medications, past family history, past medical history, past social history, past surgical history and problem list.  ROS as in subjective above    Objective:  Alert and in no distress. +2 now and Phalen's test. No atrophy noted normal strength. Normal sensation. A1C9.2   Assessment:   Type 2 diabetes mellitus with complication, without long-term current use of insulin (HCC) - Plan: insulin detemir (LEVEMIR) 100 UNIT/ML injection  Diabetes mellitus without complication (HCC) - Plan: POCT A1C  Morbid obesity due to excess calories  (HCC)  Hypertension associated with diabetes (HCC)  Hyperlipidemia associated with type 2 diabetes mellitus (HCC)  Diabetic cataract, associated with type 2 diabetes mellitus (HCC)  Carpal tunnel syndrome of right wrist - Plan: Motor nerve conduction test w/ f-wave study, CANCELED: Motor nerve conduction test w/ f-wave study  Acute URI    Plan:   Diabetes Mellitus type 2: Education: Reviewed 'ABCs' of diabetes management (respective goals in parentheses):  A1C (<7), blood pressure (<130/80), and cholesterol (LDL <100)   Diabetes mellitus Type II, under poor control.   Compliance at present is estimated to be good. Efforts to improve compliance (if necessary) will be directed at regular blood sugar monitoring: 3 times daily.    Blood pressure: normal blood pressure .   An ACE/ARB Is hot currently part of their treatment regimen.   Dyslipidemia under fair control. .  A statin Is currently part of their treatment regimen.   Encouraged aerobic exercise. Discussed foot care.   I will add Levemir 10 units daily. She is to check her blood sugars regularly throughout the day and email them to me in one week. Hopefully we can keep her on this a very short period of time until she quiets down from all of her dental surgeries and most recent URI. She does seem to be resolving from the URI and I therefore did not give her an antibiotic. Follow up: 4 months

## 2015-03-26 ENCOUNTER — Telehealth: Payer: Self-pay

## 2015-03-26 ENCOUNTER — Other Ambulatory Visit: Payer: Self-pay | Admitting: Family Medicine

## 2015-03-26 MED ORDER — "NEEDLE (DISP) 25G X 7/8"" MISC": Status: DC

## 2015-03-26 NOTE — Telephone Encounter (Signed)
Sent in needles for her insulin

## 2015-03-26 NOTE — Telephone Encounter (Signed)
[facility] [place] [facility] saying pt is requesting a needles script to go with her insulin

## 2015-03-26 NOTE — Telephone Encounter (Signed)
Call this in 

## 2015-03-27 ENCOUNTER — Telehealth: Payer: Self-pay | Admitting: Family Medicine

## 2015-03-27 DIAGNOSIS — G5600 Carpal tunnel syndrome, unspecified upper limb: Secondary | ICD-10-CM

## 2015-03-27 NOTE — Telephone Encounter (Signed)
Patient called in for another issue today and mentioned that we were going to set her up for nerve conduction studies for her carpal tunnel. She has not heard anything and actually does not want to go until after 04/05/15 but I do not see a referral for this in the system

## 2015-03-27 NOTE — Telephone Encounter (Signed)
Go ahead and set this up 

## 2015-03-28 ENCOUNTER — Other Ambulatory Visit: Payer: Self-pay | Admitting: *Deleted

## 2015-03-28 MED ORDER — INSULIN DETEMIR 100 UNIT/ML FLEXPEN: 10.0000 [IU] | PEN_INJECTOR | Freq: Every day | SUBCUTANEOUS | Status: DC

## 2015-03-28 MED ORDER — PEN NEEDLES 31G X 8 MM MISC: 1.0000 | Freq: Two times a day (BID) | Status: DC

## 2015-03-29 ENCOUNTER — Other Ambulatory Visit: Payer: Self-pay | Admitting: *Deleted

## 2015-03-29 ENCOUNTER — Encounter: Payer: Self-pay | Admitting: Family Medicine

## 2015-03-29 ENCOUNTER — Telehealth: Payer: Self-pay | Admitting: Family Medicine

## 2015-03-29 DIAGNOSIS — G5601 Carpal tunnel syndrome, right upper limb: Secondary | ICD-10-CM

## 2015-03-29 NOTE — Telephone Encounter (Signed)
Marcia Paul sent this to me to forward to you  Dr Susann Givens asked me to send my blood sugars readings for the past week to him through [facility] CHART.  I tried to send this morning but I was having some problems with the site, and I am not sure they went through. If you would, can you see he gets these.  Thanks,  Marcia Paul  All readings are morning readings before I took my shot of 10 units insulin, and before breakfast  Jan 20 234  21 244  22 236  23 238  24 225  25 215  26 242

## 2015-03-31 ENCOUNTER — Other Ambulatory Visit: Payer: Self-pay | Admitting: Family Medicine

## 2015-03-31 NOTE — Telephone Encounter (Signed)
Have her increase her insulin by 2 units every days until morning sugar is under 120 and call when that occurs

## 2015-04-02 ENCOUNTER — Other Ambulatory Visit: Payer: Self-pay | Admitting: *Deleted

## 2015-04-02 MED ORDER — ONETOUCH ULTRASOFT LANCETS MISC: Status: DC

## 2015-04-02 NOTE — Telephone Encounter (Signed)
Left VM that patient should increase her insulin daily by 2 units until morning blood sugars are 120 And to call as soon as that occurs.

## 2015-04-03 ENCOUNTER — Ambulatory Visit (INDEPENDENT_AMBULATORY_CARE_PROVIDER_SITE_OTHER): Payer: Medicare Other | Admitting: Neurology

## 2015-04-03 DIAGNOSIS — G5601 Carpal tunnel syndrome, right upper limb: Secondary | ICD-10-CM | POA: Diagnosis not present

## 2015-04-03 NOTE — Procedures (Signed)
[facility]  [address] [place], [address]  [place], [place] [address] Tel: [phone] Fax:  [phone] Test Date:  04/03/2015  Patient: Marcia Paul DOB: May 16, 1945 Physician: Nita Sickle  Sex: Female Height:  Ref Phys: Sharlot Gowda, M.D.  ID#: [id number] Temp: 33.5C Technician: Judie Petit. Dean   Patient Complaints: This is a 70 year old female referred for evaluation of right hand numbness and tingling.  NCV & EMG Findings: Extensive electrodiagnostic testing of the right upper extremity shows: 1. Right median sensory response is absent. Right ulnar sensory response is within normal limits. 2. Right median motor response shows markedly prolonged distal onset latency (8.4 ms) with normal amplitude. Right ulnar motor responses are within normal limits. 3. There is no evidence of active or chronic motor axon loss changes affecting any of the tested muscles.  Impression: 1. Right median neuropathy at or distal to the wrist, consistent with clinical diagnosis of carpal tunnel syndrome. Overall, these findings are severe in degree electrically. 2. There is no evidence of a cervical radiculopathy affecting the right upper extremity.   _____________________________ Nita Sickle, D.O.    Nerve Conduction Studies Anti Sensory Summary Table   Stim Site NR Peak (ms) Norm Peak (ms) P-T Amp (V) Norm P-T Amp  Right Median Anti Sensory (2nd Digit)  33.5C  Wrist NR  <3.8  >10  Right Ulnar Anti Sensory (5th Digit)  33.5C  Wrist    3.1 <3.2 14.1 >5   Motor Summary Table   Stim Site NR Onset (ms) Norm Onset (ms) O-P Amp (mV) Norm O-P Amp Site1 Site2 Delta-0 (ms) Dist (cm) Vel (m/s) Norm Vel (m/s)  Right Median Motor (Abd Poll Brev)  33.5C  Wrist    8.4 <4.0 6.2 >5 Elbow Wrist 4.8 26.0 54 >50  Elbow    13.2  5.4         Right Ulnar Motor (Abd Dig Minimi)  33.5C  Wrist    3.1 <3.1 8.4 >7 B Elbow Wrist 3.9 21.0 54 >50  B Elbow    7.0  8.1  A Elbow B Elbow 1.7 10.0 59 >50    A Elbow    8.7  7.8          EMG   Side Muscle Ins Act Fibs Psw Fasc Number Recrt Dur Dur. Amp Amp. Poly Poly. Comment  Right 1stDorInt Nml Nml Nml Nml Nml Nml Nml Nml Nml Nml Nml Nml N/A  Right Abd Poll Brev Nml Nml Nml Nml Nml Nml Nml Nml Nml Nml Nml Nml N/A  Right Ext Indicis Nml Nml Nml Nml Nml Nml Nml Nml Nml Nml Nml Nml N/A  Right PronatorTeres Nml Nml Nml Nml Nml Nml Nml Nml Nml Nml Nml Nml N/A  Right Biceps Nml Nml Nml Nml Nml Nml Nml Nml Nml Nml Nml Nml N/A  Right Triceps Nml Nml Nml Nml Nml Nml Nml Nml Nml Nml Nml Nml N/A  Right Deltoid Nml Nml Nml Nml Nml Nml Nml Nml Nml Nml Nml Nml N/A      Waveforms:

## 2015-04-04 ENCOUNTER — Other Ambulatory Visit: Payer: Self-pay | Admitting: Internal Medicine

## 2015-04-04 DIAGNOSIS — G5601 Carpal tunnel syndrome, right upper limb: Secondary | ICD-10-CM

## 2015-04-05 ENCOUNTER — Ambulatory Visit
Admission: RE | Admit: 2015-04-05 | Discharge: 2015-04-05 | Disposition: A | Discharge status: Home / Self Care | Payer: Medicare Other | Source: Ambulatory Visit | Attending: Family Medicine | Admitting: Family Medicine

## 2015-04-05 DIAGNOSIS — N6489 Other specified disorders of breast: Secondary | ICD-10-CM

## 2015-04-16 ENCOUNTER — Other Ambulatory Visit: Payer: Medicare Other

## 2015-04-24 ENCOUNTER — Telehealth: Payer: Self-pay | Admitting: Family Medicine

## 2015-04-24 NOTE — Telephone Encounter (Signed)
Spoke to patient this morning concerning her blood sugars. Dr. Susann Givens has her increasing her insulin every 2 days until she reaches her goal Currently her morning readings are ranging 209-265. Current dose is 26 but will be increasing to 28 tomorrow.  She is having a lot of pain from carpal tunnel and will be having surgery in April, she has been told that being in pain may increase her blood sugars.  Her question is, she will be leaving on Monday to go out of town and will run out of her insulin while she is gone and since she is not at her final dose can she get a sample To cover her while she is out of town. She doesn't want to fill another 90 day supply RX until she gets on her maintenance dose.  Sample was approved by Dr. Susann Givens, patient informed

## 2015-04-25 ENCOUNTER — Other Ambulatory Visit: Payer: Self-pay | Admitting: Medical

## 2015-04-25 ENCOUNTER — Other Ambulatory Visit: Payer: Self-pay | Admitting: Orthopedic Surgery

## 2015-04-25 NOTE — Telephone Encounter (Signed)
Is this ok to refill? Doesn't look like you have filled this?

## 2015-04-26 ENCOUNTER — Telehealth: Payer: Self-pay | Admitting: Family Medicine

## 2015-04-26 ENCOUNTER — Other Ambulatory Visit: Payer: Self-pay | Admitting: Family Medicine

## 2015-04-26 NOTE — Telephone Encounter (Signed)
Is this ok to refill? Looks like it was discontinued at her last visit

## 2015-04-26 NOTE — Telephone Encounter (Signed)
Pt stopped by and asked about her Benicar Refill, advised we were waiting on a response from you because the last office visit shows that Benicar was discontinued.  She states that's just fine if you want to stop it but said just let her know.

## 2015-04-26 NOTE — Telephone Encounter (Signed)
2 samples of Levemir 100 unit flex touch given to pt

## 2015-04-30 NOTE — Telephone Encounter (Signed)
Pt informed that Dr. Susann Givens sent in refill and wants her to continue this medication

## 2015-05-31 ENCOUNTER — Other Ambulatory Visit (HOSPITAL_COMMUNITY): Payer: Self-pay | Admitting: *Deleted

## 2015-05-31 NOTE — Pre-Procedure Instructions (Signed)
Lucretia FieldLinda S Mayhan  05/31/2015     Your procedure is scheduled on Thursday, June 07, 2015 at 8:30 AM.   [facility] "[facility]" Admitting Office at 6:30 AM.   Call this number if you have problems the morning of surgery: [phone]   Any questions prior to day of surgery, please call [phone] between 8 & 4 PM.   Remember:  Do not eat food or drink liquids after midnight Wednesday, 06/06/15.  Take these medicines the morning of surgery with A SIP OF WATER: NONE Stop Aspirin, Multivitamins and Herbal Medications as of today.     How to Manage Your Diabetes Before Surgery   Why is it important to control my blood sugar before and after surgery?   Improving blood sugar levels before and after surgery helps healing and can limit problems.  A way of improving blood sugar control is eating a healthy diet by:  - Eating less sugar and carbohydrates  - Increasing activity/exercise  - Talk with your doctor about reaching your blood sugar goals  High blood sugars (greater than 180 mg/dL) can raise your risk of infections and slow down your recovery so you will need to focus on controlling your diabetes during the weeks before surgery.  Make sure that the doctor who takes care of your diabetes knows about your planned surgery including the date and location.  How do I manage my blood sugars before surgery?   Check your blood sugar at least 4 times a day, 2 days before surgery to make sure that they are not too high or low.  Check your blood sugar the morning of your surgery when you wake up and every 2 hours until you get to the Short-Stay unit.  Treat a low blood sugar (less than 70 mg/dL) with 1/2 cup of clear juice (cranberry or apple), 4 glucose tablets, OR glucose gel.  Recheck blood sugar in 15 minutes after treatment (to make sure it is greater than 70 mg/dL).  If blood sugar is not greater than 70 mg/dL on re-check, call [phone] for further  instructions.   Report your blood sugar to the Short-Stay nurse when you get to Short-Stay.  References:  [facility], 2007 "How to Manage your Diabetes Before and After Surgery".  What do I do about my diabetes medications?   Do not take oral diabetes medicines (pills) the morning of surgery.  THE NIGHT BEFORE SURGERY, take  units of  Insulin.    THE MORNING OF SURGERY, take  units of  Insulin.    Do not wear jewelry, make-up or nail polish.  Do not wear lotions, powders, or perfumes.  You may wear deodorant.  Do not shave 48 hours prior to surgery.    Do not bring valuables to the hospital.  [facility] is not responsible for any belongings or valuables.  Contacts, dentures or bridgework may not be worn into surgery.  Leave your suitcase in the car.  After surgery it may be brought to your room.  For patients admitted to the hospital, discharge time will be determined by your treatment team.  Patients discharged the day of surgery will not be allowed to drive home.   Special instructions:  [facility] - Preparing for Surgery  Before surgery, you can play an important role.  Because skin is not sterile, your skin needs to be as free of germs as possible.  You can reduce the number of germs  on you skin by washing with CHG (chlorahexidine gluconate) soap before surgery.  CHG is an antiseptic cleaner which kills germs and bonds with the skin to continue killing germs even after washing.  Please DO NOT use if you have an allergy to CHG or antibacterial soaps.  If your skin becomes reddened/irritated stop using the CHG and inform your nurse when you arrive at Short Stay.  Do not shave (including legs and underarms) for at least 48 hours prior to the first CHG shower.  You may shave your face.  Please follow these instructions carefully:   1.  Shower with CHG Soap the night before surgery and the                                morning of Surgery.  2.   If you choose to wash your hair, wash your hair first as usual with your       normal shampoo.  3.  After you shampoo, rinse your hair and body thoroughly to remove the                      Shampoo.  4.  Use CHG as you would any other liquid soap.  You can apply chg directly       to the skin and wash gently with scrungie or a clean washcloth.  5.  Apply the CHG Soap to your body ONLY FROM THE NECK DOWN.        Do not use on open wounds or open sores.  Avoid contact with your eyes, ears, mouth and genitals (private parts).  Wash genitals (private parts) with your normal soap.  6.  Wash thoroughly, paying special attention to the area where your surgery        will be performed.  7.  Thoroughly rinse your body with warm water from the neck down.  8.  DO NOT shower/wash with your normal soap after using and rinsing off       the CHG Soap.  9.  Pat yourself dry with a clean towel.            10.  Wear clean pajamas.            11.  Place clean sheets on your bed the night of your first shower and do not        sleep with pets.  Day of Surgery  Do not apply any lotions the morning of surgery.  Please wear clean clothes to the hospital.   Please read over the following fact sheets that you were given. Pain Booklet, Coughing and Deep Breathing and Surgical Site Infection Prevention

## 2015-05-31 NOTE — Pre-Procedure Instructions (Signed)
Marcia Paul  05/31/2015     Your procedure is scheduled on Thursday, June 07, 2015 at 8:30 AM.   Report to [facility] Entrance "A" Admitting Office at 6:30 AM.   Call this number if you have problems the morning of surgery: [phone]   Any questions prior to day of surgery, please call [phone] between 8 & 4 PM.   Remember:  Do not eat food or drink liquids after midnight Wednesday, 06/06/15.  Take these medicines the morning of surgery with A SIP OF WATER: NONE Stop Aspirin, Multivitamins and Herbal Medications as of today.    How to Manage Your Diabetes Before and After Surgery  Why is it important to control my blood sugar before and after surgery? . Improving blood sugar levels before and after surgery helps healing and can limit problems. . A way of improving blood sugar control is eating a healthy diet by: o  Eating less sugar and carbohydrates o  Increasing activity/exercise o  Talking with your doctor about reaching your blood sugar goals . High blood sugars (greater than 180 mg/dL) can raise your risk of infections and slow your recovery, so you will need to focus on controlling your diabetes during the weeks before surgery. . Make sure that the doctor who takes care of your diabetes knows about your planned surgery including the date and location.  How do I manage my blood sugar before surgery? . Check your blood sugar at least 4 times a day, starting 2 days before surgery, to make sure that the level is not too high or low. o Check your blood sugar the morning of your surgery when you wake up and every 2 hours until you get to the Short Stay unit. . If your blood sugar is less than 70 mg/dL, you will need to treat for low blood sugar: o Do not take insulin. o Treat a low blood sugar (less than 70 mg/dL) with  cup of clear juice (cranberry or apple), 4 glucose tablets, OR glucose gel. o Recheck blood sugar in 15 minutes after treatment (to make  sure it is greater than 70 mg/dL). If your blood sugar is not greater than 70 mg/dL on recheck, call [phone] for further instructions. . Report your blood sugar to the short stay nurse when you get to Short Stay.  . If you are admitted to the hospital after surgery: o Your blood sugar will be checked by the staff and you will probably be given insulin after surgery (instead of oral diabetes medicines) to make sure you have good blood sugar levels. o The goal for blood sugar control after surgery is 80-180 mg/dL.              WHAT DO I DO ABOUT MY DIABETES MEDICATION?   Marland Kitchen Do not take oral diabetes medicines (pills) the morning of surgery.  . THE NIGHT BEFORE SURGERY, take ___________ units of ___________insulin.       . THE MORNING OF SURGERY, take _____________ units of __________insulin.    Do not wear jewelry, make-up or nail polish.  Do not wear lotions, powders, or perfumes.  You may wear deodorant.  Do not shave 48 hours prior to surgery.    Do not bring valuables to the hospital.  [facility] is not responsible for any belongings or valuables.  Contacts, dentures or bridgework may not be worn into surgery.  Leave your suitcase in the car.  After surgery it may be  brought to your room.  For patients admitted to the hospital, discharge time will be determined by your treatment team.  Patients discharged the day of surgery will not be allowed to drive home.   Special instructions:  [place] - Preparing for Surgery  Before surgery, you can play an important role.  Because skin is not sterile, your skin needs to be as free of germs as possible.  You can reduce the number of germs on you skin by washing with CHG (chlorahexidine gluconate) soap before surgery.  CHG is an antiseptic cleaner which kills germs and bonds with the skin to continue killing germs even after washing.  Please DO NOT use if you have an allergy to CHG or antibacterial soaps.  If your skin  becomes reddened/irritated stop using the CHG and inform your nurse when you arrive at Short Stay.  Do not shave (including legs and underarms) for at least 48 hours prior to the first CHG shower.  You may shave your face.  Please follow these instructions carefully:   1.  Shower with CHG Soap the night before surgery and the                                morning of Surgery.  2.  If you choose to wash your hair, wash your hair first as usual with your       normal shampoo.  3.  After you shampoo, rinse your hair and body thoroughly to remove the                      Shampoo.  4.  Use CHG as you would any other liquid soap.  You can apply chg directly       to the skin and wash gently with scrungie or a clean washcloth.  5.  Apply the CHG Soap to your body ONLY FROM THE NECK DOWN.        Do not use on open wounds or open sores.  Avoid contact with your eyes, ears, mouth and genitals (private parts).  Wash genitals (private parts) with your normal soap.  6.  Wash thoroughly, paying special attention to the area where your surgery        will be performed.  7.  Thoroughly rinse your body with warm water from the neck down.  8.  DO NOT shower/wash with your normal soap after using and rinsing off       the CHG Soap.  9.  Pat yourself dry with a clean towel.            10.  Wear clean pajamas.            11.  Place clean sheets on your bed the night of your first shower and do not        sleep with pets.  Day of Surgery  Do not apply any lotions the morning of surgery.  Please wear clean clothes to the hospital.   Please read over the following fact sheets that you were given. Pain Booklet, Coughing and Deep Breathing and Surgical Site Infection Prevention

## 2015-06-01 ENCOUNTER — Telehealth: Payer: Self-pay | Admitting: Family Medicine

## 2015-06-01 ENCOUNTER — Other Ambulatory Visit: Payer: Self-pay | Admitting: Family Medicine

## 2015-06-01 ENCOUNTER — Encounter (HOSPITAL_COMMUNITY)
Admission: RE | Admit: 2015-06-01 | Discharge: 2015-06-01 | Disposition: A | Discharge status: Home / Self Care | Payer: Medicare Other | Source: Ambulatory Visit | Attending: Orthopedic Surgery | Admitting: Orthopedic Surgery

## 2015-06-01 ENCOUNTER — Encounter (HOSPITAL_COMMUNITY): Payer: Self-pay

## 2015-06-01 DIAGNOSIS — E119 Type 2 diabetes mellitus without complications: Secondary | ICD-10-CM | POA: Insufficient documentation

## 2015-06-01 DIAGNOSIS — Z86718 Personal history of other venous thrombosis and embolism: Secondary | ICD-10-CM | POA: Diagnosis not present

## 2015-06-01 DIAGNOSIS — I10 Essential (primary) hypertension: Secondary | ICD-10-CM | POA: Diagnosis not present

## 2015-06-01 DIAGNOSIS — Z79899 Other long term (current) drug therapy: Secondary | ICD-10-CM | POA: Diagnosis not present

## 2015-06-01 DIAGNOSIS — Z794 Long term (current) use of insulin: Secondary | ICD-10-CM | POA: Insufficient documentation

## 2015-06-01 DIAGNOSIS — Z7982 Long term (current) use of aspirin: Secondary | ICD-10-CM | POA: Insufficient documentation

## 2015-06-01 DIAGNOSIS — Z01818 Encounter for other preprocedural examination: Secondary | ICD-10-CM | POA: Insufficient documentation

## 2015-06-01 DIAGNOSIS — Z87891 Personal history of nicotine dependence: Secondary | ICD-10-CM | POA: Diagnosis not present

## 2015-06-01 DIAGNOSIS — G5601 Carpal tunnel syndrome, right upper limb: Secondary | ICD-10-CM | POA: Insufficient documentation

## 2015-06-01 DIAGNOSIS — Z6841 Body Mass Index (BMI) 40.0 and over, adult: Secondary | ICD-10-CM | POA: Diagnosis not present

## 2015-06-01 DIAGNOSIS — Z01812 Encounter for preprocedural laboratory examination: Secondary | ICD-10-CM | POA: Diagnosis not present

## 2015-06-01 HISTORY — DX: Nausea with vomiting, unspecified: R11.2

## 2015-06-01 HISTORY — DX: Personal history of other (healed) physical injury and trauma: Z87.828

## 2015-06-01 HISTORY — DX: Other specified postprocedural states: Z98.890

## 2015-06-01 HISTORY — DX: Reserved for inherently not codable concepts without codable children: IMO0001

## 2015-06-01 HISTORY — DX: Anemia, unspecified: D64.9

## 2015-06-01 HISTORY — DX: Personal history of other venous thrombosis and embolism: Z86.718

## 2015-06-01 HISTORY — DX: Cytomegaloviral hepatitis: B25.1

## 2015-06-01 HISTORY — DX: Constipation, unspecified: K59.00

## 2015-06-01 LAB — CBC
HCT: 41.5 % (ref 36.0–46.0)
HEMOGLOBIN: 13.5 g/dL (ref 12.0–15.0)
MCH: 28.9 pg (ref 26.0–34.0)
MCHC: 32.5 g/dL (ref 30.0–36.0)
MCV: 88.9 fL (ref 78.0–100.0)
PLATELETS: 259 10*3/uL (ref 150–400)
RBC: 4.67 MIL/uL (ref 3.87–5.11)
RDW: 13.9 % (ref 11.5–15.5)
WBC: 6.4 10*3/uL (ref 4.0–10.5)

## 2015-06-01 LAB — BASIC METABOLIC PANEL
Anion gap: 10 (ref 5–15)
BUN: 15 mg/dL (ref 6–20)
CALCIUM: 10.2 mg/dL (ref 8.9–10.3)
CHLORIDE: 103 mmol/L (ref 101–111)
CO2: 25 mmol/L (ref 22–32)
CREATININE: 0.88 mg/dL (ref 0.44–1.00)
GFR calc Af Amer: >60 mL/min (ref >60)
GFR calc non Af Amer: >60 mL/min (ref >60)
GLUCOSE: 176 mg/dL — AB (ref 65–99)
Potassium: 4.5 mmol/L (ref 3.5–5.1)
Sodium: 138 mmol/L (ref 135–145)

## 2015-06-01 LAB — GLUCOSE, CAPILLARY: Glucose-Capillary: 185 mg/dL — ABNORMAL HIGH (ref 65–99)

## 2015-06-01 NOTE — Telephone Encounter (Signed)
See note below.Marland Kitchen.Marland Kitchen.Marland Kitchen.Also, she is having carpal tunnel surgery Thursday, April 6 at [facility]

## 2015-06-01 NOTE — Telephone Encounter (Signed)
Her blood sugars are still up. Today when she went for pre op for her surgery it was 185 and she is on 52 units Can we give her another Levemir flex pen sample? Trying to avoid filling 90 day RX until her dosage is set  Her husband, Ed is coming in today and he is bring a list of her blood sugar readings and dosage

## 2015-06-01 NOTE — Pre-Procedure Instructions (Signed)
Lucretia FieldLinda S Tappen  06/01/2015     Your procedure is scheduled on Thursday, June 07, 2015 at 8:30 AM.   [facility] Entrance "A" Admitting Office at 6:30 AM.   Call this number if you have problems the morning of surgery: [phone]   Any questions prior to day of surgery, please call [phone] between 8 & 4 PM.   Remember:  Do not eat food or drink liquids after midnight Wednesday, 06/06/15.  Take these medicines the morning of surgery with A SIP OF WATER: NONE Stop Aspirin, Multivitamins and Herbal Medications as of today.   How to Manage Your Diabetes Before Surgery   Why is it important to control my blood sugar before and after surgery?   Improving blood sugar levels before and after surgery helps healing and can limit problems.  A way of improving blood sugar control is eating a healthy diet by:  - Eating less sugar and carbohydrates  - Increasing activity/exercise  - Talk with your doctor about reaching your blood sugar goals  High blood sugars (greater than 180 mg/dL) can raise your risk of infections and slow down your recovery so you will need to focus on controlling your diabetes during the weeks before surgery.  Make sure that the doctor who takes care of your diabetes knows about your planned surgery including the date and location.  How do I manage my blood sugars before surgery?   Check your blood sugar at least 4 times a day, 2 days before surgery to make sure that they are not too high or low.  Check your blood sugar the morning of your surgery when you wake up and every 2 hours until you get to the Short-Stay unit.  Treat a low blood sugar (less than 70 mg/dL) with 1/2 cup of clear juice (cranberry or apple), 4 glucose tablets, OR glucose gel.  Recheck blood sugar in 15 minutes after treatment (to make sure it is greater than 70 mg/dL).  If blood sugar is not greater than 70 mg/dL on re-check, call [phone] for further  instructions.   Report your blood sugar to the Short-Stay nurse when you get to Short-Stay.  References:  [facility], 2007 "How to Manage your Diabetes Before and After Surgery".  What do I do about my diabetes medications?   Do not take oral diabetes medicines (pills) the morning of surgery.  THE NIGHT BEFORE SURGERY, take 80% of your regular dose of Levemir Insulin.   Do not wear jewelry, make-up or nail polish.  Do not wear lotions, powders, or perfumes.  You may wear deodorant.  Do not shave 48 hours prior to surgery.    Do not bring valuables to the hospital.  [facility] is not responsible for any belongings or valuables.  Contacts, dentures or bridgework may not be worn into surgery.  Leave your suitcase in the car.  After surgery it may be brought to your room.  For patients admitted to the hospital, discharge time will be determined by your treatment team.  Patients discharged the day of surgery will not be allowed to drive home.   Special instructions:  [place] - Preparing for Surgery  Before surgery, you can play an important role.  Because skin is not sterile, your skin needs to be as free of germs as possible.  You can reduce the number of germs on you skin by washing with CHG (chlorahexidine gluconate) soap before surgery.  CHG is an antiseptic cleaner which kills germs and bonds with the skin to continue killing germs even after washing.  Please DO NOT use if you have an allergy to CHG or antibacterial soaps.  If your skin becomes reddened/irritated stop using the CHG and inform your nurse when you arrive at Short Stay.  Do not shave (including legs and underarms) for at least 48 hours prior to the first CHG shower.  You may shave your face.  Please follow these instructions carefully:   1.  Shower with CHG Soap the night before surgery and the                                morning of Surgery.  2.  If you choose to wash your hair,  wash your hair first as usual with your       normal shampoo.  3.  After you shampoo, rinse your hair and body thoroughly to remove the                      Shampoo.  4.  Use CHG as you would any other liquid soap.  You can apply chg directly       to the skin and wash gently with scrungie or a clean washcloth.  5.  Apply the CHG Soap to your body ONLY FROM THE NECK DOWN.        Do not use on open wounds or open sores.  Avoid contact with your eyes, ears, mouth and genitals (private parts).  Wash genitals (private parts) with your normal soap.  6.  Wash thoroughly, paying special attention to the area where your surgery        will be performed.  7.  Thoroughly rinse your body with warm water from the neck down.  8.  DO NOT shower/wash with your normal soap after using and rinsing off       the CHG Soap.  9.  Pat yourself dry with a clean towel.            10.  Wear clean pajamas.            11.  Place clean sheets on your bed the night of your first shower and do not        sleep with pets.  Day of Surgery  Do not apply any lotions the morning of surgery.  Please wear clean clothes to the hospital.   Please read over the following fact sheets that you were given. Pain Booklet, Coughing and Deep Breathing and Surgical Site Infection Prevention
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
## 2015-06-01 NOTE — Telephone Encounter (Signed)
Have her schedule an appointment for after her surgery so we can discuss this further

## 2015-06-01 NOTE — Progress Notes (Signed)
Pt denies cardiac history, chest pain or sob. States she will get sob with going up stairs (she stated "but look at how much I weigh"). Hx of DVT around 10 years ago, is not on long term anticoagulant. Pt states she is nervous about anesthesia, states that when she had her colonoscopy she did very well with the Propofol and hopes that she can have that with her surgery.  Pt is diabetic, last A1C in January was 9.2. She states her blood sugar got out of control this past fall from an infection and having to be on steroids. She states she was started on Levemir at 2 units and was increasing it by 2 units each day. States Dr. Susann GivensLalonde wants her fasting blood sugar to be in the 120's. She is now increasing it by 2 units every 3rd day. Fasting blood sugar is still running between 185-200.

## 2015-06-01 NOTE — Pre-Procedure Instructions (Signed)
  Verlene S Prows  06/01/2015     Your procedure is scheduled on Thursday, June 07, 2015 at 8:30 AM.   Report to [facility] Entrance "A" Admitting Office at 6:30 AM.   Call this number if you have problems the morning of surgery: [phone]   Any questions prior to day of surgery, please call [phone] between 8 & 4 PM.   Remember:  Do not eat food or drink liquids after midnight Wednesday, 06/06/15.  Take these medicines the morning of surgery with A SIP OF WATER: NONE Stop Aspirin, Multivitamins and Herbal Medications as of today.   How to Manage Your Diabetes Before Surgery   Why is it important to control my blood sugar before and after surgery?   Improving blood sugar levels before and after surgery helps healing and can limit problems.  A way of improving blood sugar control is eating a healthy diet by:  - Eating less sugar and carbohydrates  - Increasing activity/exercise  - Talk with your doctor about reaching your blood sugar goals  High blood sugars (greater than 180 mg/dL) can raise your risk of infections and slow down your recovery so you will need to focus on controlling your diabetes during the weeks before surgery.  Make sure that the doctor who takes care of your diabetes knows about your planned surgery including the date and location.  How do I manage my blood sugars before surgery?   Check your blood sugar at least 4 times a day, 2 days before surgery to make sure that they are not too high or low.  Check your blood sugar the morning of your surgery when you wake up and every 2 hours until you get to the Short-Stay unit.  Treat a low blood sugar (less than 70 mg/dL) with 1/2 cup of clear juice (cranberry or apple), 4 glucose tablets, OR glucose gel.  Recheck blood sugar in 15 minutes after treatment (to make sure it is greater than 70 mg/dL).  If blood sugar is not greater than 70 mg/dL on re-check, call [phone] for further  instructions.   Report your blood sugar to the Short-Stay nurse when you get to Short-Stay.  References:  [facility], 2007 "How to Manage your Diabetes Before and After Surgery".  What do I do about my diabetes medications?   Do not take oral diabetes medicines (pills) the morning of surgery.  THE NIGHT BEFORE SURGERY, take 80% of your regular dose of Levemir Insulin.   Do not wear jewelry, make-up or nail polish.  Do not wear lotions, powders, or perfumes.  You may wear deodorant.  Do not shave 48 hours prior to surgery.    Do not bring valuables to the hospital.  [facility] is not responsible for any belongings or valuables.  Contacts, dentures or bridgework may not be worn into surgery.  Leave your suitcase in the car.  After surgery it may be brought to your room.  For patients admitted to the hospital, discharge time will be determined by your treatment team.  Patients discharged the day of surgery will not be allowed to drive home.   Special instructions:  [place] - Preparing for Surgery  Before surgery, you can play an important role.  Because skin is not sterile, your skin needs to be as free of germs as possible.  You can reduce the number of germs on you skin by washing with CHG (chlorahexidine gluconate) soap before surgery.    CHG is an antiseptic cleaner which kills germs and bonds with the skin to continue killing germs even after washing.  Please DO NOT use if you have an allergy to CHG or antibacterial soaps.  If your skin becomes reddened/irritated stop using the CHG and inform your nurse when you arrive at Short Stay.  Do not shave (including legs and underarms) for at least 48 hours prior to the first CHG shower.  You may shave your face.  Please follow these instructions carefully:   1.  Shower with CHG Soap the night before surgery and the                                morning of Surgery.  2.  If you choose to wash your hair,  wash your hair first as usual with your       normal shampoo.  3.  After you shampoo, rinse your hair and body thoroughly to remove the                      Shampoo.  4.  Use CHG as you would any other liquid soap.  You can apply chg directly       to the skin and wash gently with scrungie or a clean washcloth.  5.  Apply the CHG Soap to your body ONLY FROM THE NECK DOWN.        Do not use on open wounds or open sores.  Avoid contact with your eyes, ears, mouth and genitals (private parts).  Wash genitals (private parts) with your normal soap.  6.  Wash thoroughly, paying special attention to the area where your surgery        will be performed.  7.  Thoroughly rinse your body with warm water from the neck down.  8.  DO NOT shower/wash with your normal soap after using and rinsing off       the CHG Soap.  9.  Pat yourself dry with a clean towel.            10.  Wear clean pajamas.            11.  Place clean sheets on your bed the night of your first shower and do not        sleep with pets.  Day of Surgery  Do not apply any lotions the morning of surgery.  Please wear clean clothes to the hospital.   Please read over the following fact sheets that you were given. Pain Booklet, Coughing and Deep Breathing and Surgical Site Infection Prevention       

## 2015-06-02 LAB — HEMOGLOBIN A1C
Hgb A1c MFr Bld: 8.9 % — ABNORMAL HIGH (ref 4.8–5.6)
Mean Plasma Glucose: 209 mg/dL

## 2015-06-04 NOTE — Telephone Encounter (Signed)
Pt will call back and schedule an appt after her surgery  bs are 164, [phone] recently

## 2015-06-06 MED ORDER — DEXTROSE 5 % IV SOLN
3.0000 g | INTRAVENOUS | Status: AC
  Administered 2015-06-07: 3 g via INTRAVENOUS
  Filled 2015-06-06: qty 3000

## 2015-06-07 ENCOUNTER — Ambulatory Visit (HOSPITAL_COMMUNITY): Payer: Medicare Other | Admitting: Vascular Surgery

## 2015-06-07 ENCOUNTER — Encounter (HOSPITAL_COMMUNITY)
Admission: RE | Disposition: A | Discharge status: Home / Self Care | Payer: Self-pay | Source: Ambulatory Visit | Attending: Orthopedic Surgery

## 2015-06-07 ENCOUNTER — Ambulatory Visit (HOSPITAL_COMMUNITY): Payer: Medicare Other | Admitting: Anesthesiology

## 2015-06-07 ENCOUNTER — Encounter (HOSPITAL_BASED_OUTPATIENT_CLINIC_OR_DEPARTMENT_OTHER): Payer: Self-pay | Admitting: Orthopedic Surgery

## 2015-06-07 ENCOUNTER — Ambulatory Visit (HOSPITAL_COMMUNITY)
Admission: RE | Admit: 2015-06-07 | Discharge: 2015-06-07 | Disposition: A | Discharge status: Home / Self Care | Payer: Medicare Other | Source: Ambulatory Visit | Attending: Orthopedic Surgery | Admitting: Orthopedic Surgery

## 2015-06-07 DIAGNOSIS — Z86718 Personal history of other venous thrombosis and embolism: Secondary | ICD-10-CM | POA: Diagnosis not present

## 2015-06-07 DIAGNOSIS — Z87891 Personal history of nicotine dependence: Secondary | ICD-10-CM | POA: Insufficient documentation

## 2015-06-07 DIAGNOSIS — I10 Essential (primary) hypertension: Secondary | ICD-10-CM | POA: Insufficient documentation

## 2015-06-07 DIAGNOSIS — E119 Type 2 diabetes mellitus without complications: Secondary | ICD-10-CM | POA: Diagnosis not present

## 2015-06-07 DIAGNOSIS — G5601 Carpal tunnel syndrome, right upper limb: Secondary | ICD-10-CM | POA: Diagnosis present

## 2015-06-07 HISTORY — PX: CARPAL TUNNEL RELEASE: SHX101

## 2015-06-07 LAB — GLUCOSE, CAPILLARY
GLUCOSE-CAPILLARY: 178 mg/dL — AB (ref 65–99)
GLUCOSE-CAPILLARY: 168 mg/dL — AB (ref 65–99)

## 2015-06-07 SURGERY — CARPAL TUNNEL RELEASE
Anesthesia: Monitor Anesthesia Care | Site: Wrist | Laterality: Right

## 2015-06-07 MED ORDER — SODIUM CHLORIDE 0.9 % IJ SOLN
INTRAMUSCULAR | Status: AC
  Filled 2015-06-07: qty 10

## 2015-06-07 MED ORDER — HYDROMORPHONE HCL 1 MG/ML IJ SOLN
0.2500 mg | INTRAMUSCULAR | Status: DC | PRN
  Administered 2015-06-07: 0.5 mg via INTRAVENOUS

## 2015-06-07 MED ORDER — ONDANSETRON HCL 4 MG/2ML IJ SOLN
INTRAMUSCULAR | Status: AC
  Filled 2015-06-07: qty 2

## 2015-06-07 MED ORDER — PHENYLEPHRINE 40 MCG/ML (10ML) SYRINGE FOR IV PUSH (FOR BLOOD PRESSURE SUPPORT)
PREFILLED_SYRINGE | INTRAVENOUS | Status: AC
  Filled 2015-06-07: qty 10

## 2015-06-07 MED ORDER — ROCURONIUM BROMIDE 50 MG/5ML IV SOLN
INTRAVENOUS | Status: AC
  Filled 2015-06-07: qty 1

## 2015-06-07 MED ORDER — BUPIVACAINE HCL (PF) 0.25 % IJ SOLN
INTRAMUSCULAR | Status: AC
  Filled 2015-06-07: qty 30

## 2015-06-07 MED ORDER — FENTANYL CITRATE (PF) 250 MCG/5ML IJ SOLN
INTRAMUSCULAR | Status: AC
  Filled 2015-06-07: qty 5

## 2015-06-07 MED ORDER — FENTANYL CITRATE (PF) 100 MCG/2ML IJ SOLN
INTRAMUSCULAR | Status: DC | PRN
  Administered 2015-06-07 (×4): 50 ug via INTRAVENOUS

## 2015-06-07 MED ORDER — HYDROMORPHONE HCL 1 MG/ML IJ SOLN
INTRAMUSCULAR | Status: AC
  Filled 2015-06-07: qty 1

## 2015-06-07 MED ORDER — CHLORHEXIDINE GLUCONATE 4 % EX LIQD: 60.0000 mL | Freq: Once | CUTANEOUS | Status: DC

## 2015-06-07 MED ORDER — LACTATED RINGERS IV SOLN
INTRAVENOUS | Status: DC
  Administered 2015-06-07 (×2): via INTRAVENOUS

## 2015-06-07 MED ORDER — ONDANSETRON HCL 4 MG/2ML IJ SOLN
INTRAMUSCULAR | Status: DC | PRN
  Administered 2015-06-07: 4 mg via INTRAVENOUS

## 2015-06-07 MED ORDER — LIDOCAINE HCL (CARDIAC) 20 MG/ML IV SOLN
INTRAVENOUS | Status: AC
  Filled 2015-06-07: qty 5

## 2015-06-07 MED ORDER — BUPIVACAINE HCL 0.25 % IJ SOLN
INTRAMUSCULAR | Status: DC | PRN
  Administered 2015-06-07: 12 mL

## 2015-06-07 MED ORDER — HYDROCODONE-ACETAMINOPHEN 5-325 MG PO TABS
2.0000 | ORAL_TABLET | Freq: Once | ORAL | Status: AC
  Administered 2015-06-07: 2 via ORAL

## 2015-06-07 MED ORDER — HYDROCODONE-ACETAMINOPHEN 5-325 MG PO TABS
ORAL_TABLET | ORAL | Status: AC
  Filled 2015-06-07: qty 2

## 2015-06-07 MED ORDER — PROPOFOL 500 MG/50ML IV EMUL
INTRAVENOUS | Status: DC | PRN
  Administered 2015-06-07: 50 ug/kg/min via INTRAVENOUS

## 2015-06-07 MED ORDER — HYDROCODONE-ACETAMINOPHEN 10-325 MG PO TABS: 1.0000 | ORAL_TABLET | Freq: Four times a day (QID) | ORAL | Status: DC | PRN

## 2015-06-07 MED ORDER — PROPOFOL 10 MG/ML IV BOLUS
INTRAVENOUS | Status: AC
  Filled 2015-06-07: qty 20

## 2015-06-07 MED ORDER — SUCCINYLCHOLINE CHLORIDE 20 MG/ML IJ SOLN
INTRAMUSCULAR | Status: AC
  Filled 2015-06-07: qty 1

## 2015-06-07 MED ORDER — EPHEDRINE SULFATE 50 MG/ML IJ SOLN
INTRAMUSCULAR | Status: AC
  Filled 2015-06-07: qty 1

## 2015-06-07 MED ORDER — MIDAZOLAM HCL 5 MG/5ML IJ SOLN
INTRAMUSCULAR | Status: DC | PRN
  Administered 2015-06-07: 2 mg via INTRAVENOUS

## 2015-06-07 MED ORDER — MIDAZOLAM HCL 2 MG/2ML IJ SOLN
INTRAMUSCULAR | Status: AC
  Filled 2015-06-07: qty 2

## 2015-06-07 SURGICAL SUPPLY — 34 items
BNDG CMPR 9X4 STRL LF SNTH (GAUZE/BANDAGES/DRESSINGS) ×1
BNDG COHESIVE 3X5 TAN STRL LF (GAUZE/BANDAGES/DRESSINGS) ×3 IMPLANT
BNDG ESMARK 4X9 LF (GAUZE/BANDAGES/DRESSINGS) ×3 IMPLANT
BNDG GAUZE ELAST 4 BULKY (GAUZE/BANDAGES/DRESSINGS) ×3 IMPLANT
CORDS BIPOLAR (ELECTRODE) ×3 IMPLANT
COVER SURGICAL LIGHT HANDLE (MISCELLANEOUS) IMPLANT
CUFF TOURNIQUET SINGLE 18IN (TOURNIQUET CUFF) ×3 IMPLANT
CUFF TOURNIQUET SINGLE 24IN (TOURNIQUET CUFF) IMPLANT
DECANTER SPIKE VIAL GLASS SM (MISCELLANEOUS) IMPLANT
DRSG PAD ABDOMINAL 8X10 ST (GAUZE/BANDAGES/DRESSINGS) ×2 IMPLANT
DURAPREP 26ML APPLICATOR (WOUND CARE) ×3 IMPLANT
GAUZE SPONGE 4X4 12PLY STRL (GAUZE/BANDAGES/DRESSINGS) ×2 IMPLANT
GAUZE XEROFORM 1X8 LF (GAUZE/BANDAGES/DRESSINGS) ×3 IMPLANT
GLOVE BIOGEL PI IND STRL 8 (GLOVE) ×1 IMPLANT
GLOVE BIOGEL PI INDICATOR 8 (GLOVE) ×2
GLOVE SURG ORTHO 8.0 STRL STRW (GLOVE) ×3 IMPLANT
GOWN STRL REUS W/ TWL LRG LVL3 (GOWN DISPOSABLE) ×1 IMPLANT
GOWN STRL REUS W/ TWL XL LVL3 (GOWN DISPOSABLE) ×1 IMPLANT
GOWN STRL REUS W/TWL LRG LVL3 (GOWN DISPOSABLE) ×3
GOWN STRL REUS W/TWL XL LVL3 (GOWN DISPOSABLE) ×3
KIT BASIN OR (CUSTOM PROCEDURE TRAY) ×3 IMPLANT
KIT ROOM TURNOVER OR (KITS) ×3 IMPLANT
MANIFOLD NEPTUNE II (INSTRUMENTS) ×3 IMPLANT
NDL HYPO 25GX1X1/2 BEV (NEEDLE) IMPLANT
NEEDLE HYPO 25GX1X1/2 BEV (NEEDLE) IMPLANT
NS IRRIG 1000ML POUR BTL (IV SOLUTION) ×3 IMPLANT
PACK ORTHO EXTREMITY (CUSTOM PROCEDURE TRAY) ×3 IMPLANT
PAD ARMBOARD 7.5X6 YLW CONV (MISCELLANEOUS) ×3 IMPLANT
PAD CAST 4YDX4 CTTN HI CHSV (CAST SUPPLIES) IMPLANT
PADDING CAST COTTON 4X4 STRL (CAST SUPPLIES) ×3
SUT ETHILON 4 0 PS 2 18 (SUTURE) ×5 IMPLANT
SYR CONTROL 10ML LL (SYRINGE) IMPLANT
TOWEL OR 17X26 10 PK STRL BLUE (TOWEL DISPOSABLE) ×3 IMPLANT
UNDERPAD 30X30 INCONTINENT (UNDERPADS AND DIAPERS) ×3 IMPLANT

## 2015-06-07 NOTE — Transfer of Care (Signed)
Immediate Anesthesia Transfer of Care Note  Patient: Marcia FieldLinda S Divito  Procedure(s) Performed: Procedure(s): RGHT CARPAL TUNNEL RELEASE (Right)  Patient Location: PACU  Anesthesia Type:MAC  Level of Consciousness: awake, alert , oriented and patient cooperative  Airway & Oxygen Therapy: Patient Spontanous Breathing  Post-op Assessment: Report given to RN, Post -op Vital signs reviewed and stable and Patient moving all extremities  Post vital signs: Reviewed and stable  Last Vitals:  Filed Vitals:   06/07/15 0648  BP: 124/73  Pulse: 83  Temp: 36.7 C  Resp: 18    Complications: No apparent anesthesia complications

## 2015-06-07 NOTE — Op Note (Signed)
Dictation Number [id number]

## 2015-06-07 NOTE — Brief Op Note (Signed)
06/07/2015  9:25 AM  PATIENT:  Marcia Paul  70 y.o. female  PRE-OPERATIVE DIAGNOSIS:  RIGHT CARPAL TUNNEL SYNDROME  POST-OPERATIVE DIAGNOSIS:  * No post-op diagnosis entered *  PROCEDURE:  Procedure(s): RGHT CARPAL TUNNEL RELEASE (Right)  SURGEON:  Surgeon(s) and Role:    * Cindee SaltGary Rahmon Heigl, MD - Primary  PHYSICIAN ASSISTANT:   ASSISTANTS: none   ANESTHESIA:   local and IV sedation  EBL:     BLOOD ADMINISTERED:none  DRAINS: none   LOCAL MEDICATIONS USED:  BUPIVICAINE   SPECIMEN:  No Specimen  DISPOSITION OF SPECIMEN:  N/A  COUNTS:  YES  TOURNIQUET:    DICTATION: .Other Dictation: Dictation Number [id number]  PLAN OF CARE: Discharge to home after PACU  PATIENT DISPOSITION:  PACU - hemodynamically stable.

## 2015-06-07 NOTE — Progress Notes (Signed)
Orthopedic Tech Progress Note Patient Details:  Marcia FieldLinda S Paul [birth year] [id number]  Patient ID: Marcia Paul, female   DOB: [birth year], 70 y.o.   MRN: [id number]  Viewed order from doctor's order list Nikki DomCrawford, Thedora Rings 06/07/2015, 10:26 AM

## 2015-06-07 NOTE — Progress Notes (Signed)
Orthopedic Tech Progress Note Patient Details:  Marcia FieldLinda S Paul 13-Nov-1945 [id number]  Ortho Devices Ortho Device/Splint Location: [name] Ortho Device/Splint Interventions: Application Sing arm foam strap  Nikki DomCrawford, [name] 06/07/2015, 10:25 AM

## 2015-06-07 NOTE — Discharge Instructions (Signed)

## 2015-06-07 NOTE — Op Note (Signed)
[name], Marcia Paul              ACCOUNT NO.:  [id number]  MEDICAL RECORD NO.:  [id number]  LOCATION:  [facility]                         FACILITY:  [facility]  PHYSICIAN:  Cindee SaltGary Samauri Kellenberger, M.D.       DATE OF BIRTH:  03-06-1945  DATE OF PROCEDURE:  06/07/2015 DATE OF DISCHARGE:                              OPERATIVE REPORT   PREOPERATIVE DIAGNOSIS:  Carpal tunnel syndrome, right hand.  POSTOPERATIVE DIAGNOSIS:  Carpal tunnel syndrome, right hand.  OPERATION:  Decompression of right median nerve.  SURGEON:  Cindee SaltGary Meisha Salone, MD  ANESTHESIA:  Local with sedation.  ANESTHESIOLOGIST:  Burna FortsJames T. Massagee, MD  PL[facility] O[facility].  HISTORY:  The patient is a 70 year old female with a history of numbness and tingling, carpal tunnel syndrome to her right hand.  Nerve conductions are markedly positive.  She has elected to undergo surgical decompression to the median nerve, right hand.  Pre, peri, and postoperative course have been discussed along with risks and complications.  She is aware that there is no guarantee with the surgery; possibility of infection; recurrence of injury to arteries, nerves, tendons; incomplete relief of symptoms; dystrophy.  In the preoperative area, the patient was seen, the extremity marked by both patient and surgeon.  Antibiotic given.  PROCEDURE IN DETAIL:  The patient was brought to the operating room, where a local anesthetic was carried out without difficulty.  After a time-out taken, a prep done with ChloraPrep.  Sedation was also given. She was in a supine position.  The limb was exsanguinated with an Esmarch bandage, approximately 10 mL of 0.25% bupivacaine without epinephrine was given.  A longitudinal incision was made after inflation of the tourniquet to 250 mmHg which was on the upper arm.  Bleeders were electrocauterized with bipolar.  The dissection carried down.  The palmar fascia was split.  The superficial palmar arch was  identified. The flexor tendon to the ring and little finger were identified. Retractors were placed retracting the median nerve radially, the ulnar nerve ulnarly.  With sharp dissection, an incision was then made through the flexor retinaculum.  A right angle and Sewall retractor were placed between skin and forearm fascia.  The proximal forearm fascia was then released after local infiltration with 0.25% bupivacaine without epinephrine was given, approximately 4 mL was used.  The forearm fascia was released under direct vision.  The canal was explored.  An area of compression to the nerve was immediately apparent.  Motor branch entered into muscle distally.  No further lesions were noted.  The wound was copiously irrigated with saline.  The skin was then closed with interrupted 4-0 nylon sutures.  A sterile compressive dressing with the fingers and thumb free was applied.  On deflation of the tourniquet, all fingers immediately pinked.  She was taken to the recovery room for observation in satisfactory condition.  She will be discharged home [facility], on [place].          ______________________________ Cindee SaltGary Avin Gibbons, M.D.     GK/MEDQ  D:  06/07/2015  T:  06/07/2015  Job:  [id number]
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
## 2015-06-07 NOTE — Progress Notes (Signed)
Report given to caroline rn as caregiver 

## 2015-06-07 NOTE — Anesthesia Postprocedure Evaluation (Signed)
Anesthesia Post Note  Patient: Marcia FieldLinda S Stebner  Procedure(s) Performed: Procedure(s) (LRB): RGHT CARPAL TUNNEL RELEASE (Right)  Patient location during evaluation: PACU Anesthesia Type: MAC Level of consciousness: awake and alert Pain management: pain level controlled Vital Signs Assessment: post-procedure vital signs reviewed and stable Respiratory status: spontaneous breathing, nonlabored ventilation, respiratory function stable and patient connected to nasal cannula oxygen Cardiovascular status: stable and blood pressure returned to baseline Anesthetic complications: no    Last Vitals:  Filed Vitals:   06/07/15 0945 06/07/15 1000  BP: 111/71 113/68  Pulse: 69 64  Temp:    Resp:  14    Last Pain:  Filed Vitals:   06/07/15 1005  PainSc: 8                  [name],JAMES TERRILL

## 2015-06-07 NOTE — Anesthesia Preprocedure Evaluation (Addendum)
Anesthesia Evaluation  Patient identified by MRN, date of birth, ID band Patient awake    Reviewed: Allergy & Precautions, NPO status , Patient's Chart, lab work & pertinent test results  History of Anesthesia Complications (+) PONV  Airway Mallampati: II  TM Distance: >3 FB Neck ROM: Full    Dental  (+) Missing, Poor Dentition   Pulmonary shortness of breath, former smoker,    + rhonchi  + decreased breath sounds      Cardiovascular hypertension,  Rhythm:Regular Rate:Normal     Neuro/Psych negative neurological ROS     GI/Hepatic (+) Hepatitis -  Endo/Other  diabetes, Poorly Controlled, Insulin DependentMorbid obesity  Renal/GU      Musculoskeletal   Abdominal (+) + obese,   Peds  Hematology   Anesthesia Other Findings   Reproductive/Obstetrics                            Anesthesia Physical Anesthesia Plan  ASA: III  Anesthesia Plan: MAC   Post-op Pain Management:    Induction: Intravenous  Airway Management Planned:   Additional Equipment: Arterial line  Intra-op Plan:   Post-operative Plan: Extubation in OR  Informed Consent: I have reviewed the patients History and Physical, chart, labs and discussed the procedure including the risks, benefits and alternatives for the proposed anesthesia with the patient or authorized representative who has indicated his/her understanding and acceptance.   Dental advisory given  Plan Discussed with: CRNA and Surgeon  Anesthesia Plan Comments:         Anesthesia Quick Evaluation

## 2015-06-08 ENCOUNTER — Encounter (HOSPITAL_COMMUNITY): Payer: Self-pay | Admitting: Orthopedic Surgery

## 2015-06-25 ENCOUNTER — Telehealth: Payer: Self-pay | Admitting: Family Medicine

## 2015-06-25 NOTE — Telephone Encounter (Signed)
Called and a prescription for roughly 70 units per day until I can see her

## 2015-06-25 NOTE — Telephone Encounter (Signed)
Please see email/ note from [name] Would you please forward this to Dr Jonny RuizJohn for me.My Levels have come down, but not to the goal of 120 first thing in the morning. The levels 2 hours after i eat are generally lower than the am levels. I have never been able to get my am levels to 120 even when my A1C was in the 6 to 7 range, but i am still working on it. I am tracking everything i eat, and have cut out the bad carbs, eating more veggies etc. and trying to walk more.  The hospital took my A1C on 3-31, a week before my surgery, and it was  8.9, lower than the 9.2 that it was in Jan.         My A1C should be  lower and closer to my goal when I have my appointment May  22 for my next A1C .  I have enough insulin for about 1 week, still working on the increase two units every 3rd day.  I am currently at 68 units each night.  I will need [facility] if you will please.   If we need to go month by month on the prescription until i reach my goal instead of a 3 month supply that will be fine.  I had my stitches out from my Carpal Tunnel surgery friday and have finally kicked the bad chest cold i have had since i had the surgery.  I am walking more now since i can finally breath with out coughing up a storm.  thank you for your help, i do appreciate it.  I have to take Ed to his eye doctor appointment at 1:00 and will be home([phone]) after 2 if you need me.

## 2015-07-19 ENCOUNTER — Telehealth: Payer: Self-pay | Admitting: Family Medicine

## 2015-07-19 NOTE — Telephone Encounter (Signed)
Waiting on Drug rep to bring samples

## 2015-07-19 NOTE — Telephone Encounter (Signed)
Give her enough to cover to when she can come in

## 2015-07-19 NOTE — Telephone Encounter (Signed)
Patient called re levemir Rx   She is coming in Monday but she will be out before then.  She called/sent message on 06/25/15 and it looks like that rx was never called in. She is due to go up to 82 units  But has not because she was afraid she would run out of meds.  Her blood sugar this morning was 170 but I don't think She took full dose she needed to, trying to conserve medication Vernona RiegerLaura is trying to contact the rep and see if we can get her a sample to get her thru until she see's you on Monday  Can we go ahead and call her in Rx for 82 units ( 90 day supply) also to make sure she doesn't run out but just tell the pharmacy not to fill it until patient calls them   Please let me know what is being done so I can contact patient back

## 2015-07-23 ENCOUNTER — Other Ambulatory Visit: Payer: Self-pay

## 2015-07-23 ENCOUNTER — Encounter: Payer: Self-pay | Admitting: Family Medicine

## 2015-07-23 ENCOUNTER — Ambulatory Visit (INDEPENDENT_AMBULATORY_CARE_PROVIDER_SITE_OTHER): Payer: Medicare Other | Admitting: Family Medicine

## 2015-07-23 VITALS — BP 130/84 | HR 78 | Wt >= 6400 oz

## 2015-07-23 DIAGNOSIS — Z794 Long term (current) use of insulin: Secondary | ICD-10-CM

## 2015-07-23 DIAGNOSIS — E1169 Type 2 diabetes mellitus with other specified complication: Secondary | ICD-10-CM

## 2015-07-23 DIAGNOSIS — E785 Hyperlipidemia, unspecified: Secondary | ICD-10-CM

## 2015-07-23 DIAGNOSIS — E118 Type 2 diabetes mellitus with unspecified complications: Secondary | ICD-10-CM | POA: Diagnosis not present

## 2015-07-23 DIAGNOSIS — E1159 Type 2 diabetes mellitus with other circulatory complications: Secondary | ICD-10-CM

## 2015-07-23 DIAGNOSIS — I10 Essential (primary) hypertension: Secondary | ICD-10-CM

## 2015-07-23 LAB — POCT GLYCOSYLATED HEMOGLOBIN (HGB A1C): Hemoglobin A1C: 7.7

## 2015-07-23 MED ORDER — INSULIN DETEMIR 100 UNIT/ML FLEXPEN: 80.0000 [IU] | PEN_INJECTOR | Freq: Every day | SUBCUTANEOUS | Status: DC

## 2015-07-23 MED ORDER — SITAGLIPTIN PHOS-METFORMIN HCL 50-1000 MG PO TABS: 1.0000 | ORAL_TABLET | Freq: Two times a day (BID) | ORAL | Status: DC

## 2015-07-23 NOTE — Patient Instructions (Signed)
Continue to walk 25-30 minutes daily and divide it  up if you want  get rid of like and dislike and just do it Don't make weight as your goal, make life style as your goal

## 2015-07-23 NOTE — Progress Notes (Signed)
  Subjective:    Patient ID: Marcia Paul, female    DOB: [birth year], 70 y.o.   MRN: [id number]  Marcia Paul is a 70 y.o. female who presents for follow-up of Type 2 diabetes mellitus.  Patient is checking home blood sugars.   Home blood sugar records: high 182 low 129. Blood sugars in the morning in the 150 range. She is now up to 82 units of insulin daily basis. How often is blood sugars being checked: BID Current symptoms/problems cant seem to get them where she needs to be Daily foot checks:yes   Any foot concerns: none Last eye exam: 10/17/14 Exercise: walking some not back up to where she wants to be . She does tend to let things get in the way of her exercising regularly.  The following portions of the patient's history were reviewed and updated as appropriate: allergies, current medications, past medical history, past social history and problem list.  ROS as in subjective above.     Objective:    Physical Exam Alert and in no distress otherwise not examined.    Lab Review Diabetic Labs Latest Ref Rng 06/01/2015 03/22/2015 10/17/2014 05/16/2014 09/18/2013  HbA1c 4.8 - 5.6 % 8.9(H) 9.2 7.9 7.3 -  Chol 125 - 200 mg/dL - - 782137 - -  HDL >=95>=46 mg/dL - - 61 - -  Calc LDL <621<130 mg/dL - - 56 - -  Triglycerides <150 mg/dL - - 308100 - -  Creatinine 0.44 - 1.00 mg/dL 6.570.88 - 8.460.75 - 9.620.90   BP/Weight 07/23/2015 06/07/2015 06/01/2015 03/22/2015 10/17/2014  Systolic BP 130 130 153 120 120  Diastolic BP 84 76 105 78 80  Wt. (Lbs) 408 415 415.35 403.2 409  BMI 58.54 59.55 59.6 57.85 58.69   Foot/eye exam completion dates Latest Ref Rng 10/19/2014 10/17/2014  Eye Exam No Retinopathy No Retinopathy -  Foot Form Completion - - Done   A1c is 7.7  her weight is down several pounds.  Marcia Paul  reports that she quit smoking about 17 months ago. Her smoking use included Cigarettes. She smoked 1.00 pack per day. She has never used smokeless tobacco. She reports that she drinks alcohol. She reports that  she does not use illicit drugs.     Assessment & Plan:    Type 2 diabetes mellitus with complication, with long-term current use of insulin (HCC) - Plan: POCT glycosylated hemoglobin (Hb A1C), sitaGLIPtin-metformin (JANUMET) 50-1000 MG tablet  Morbid obesity due to excess calories (HCC)  Hypertension associated with diabetes (HCC)  Hyperlipidemia associated with type 2 diabetes mellitus (HCC)   1. Rx changes:  increase Janumet to 50/1000 twice a day 2. Education: Reviewed 'ABCs' of diabetes management (respective goals in parentheses):  A1C (<7), blood pressure (<130/80), and cholesterol (LDL <100). 3. Compliance at present is estimated to be fair. Efforts to improve compliance (if necessary) will be directed at increased exercise. 4. Follow up: 4 months  I again stressed the need for her to get involved in a regular exercise program as well as making dietary changes. She tends to bounce around and let things get in the way of exercising regularly. Explained that she needs to get rid of the thought of liking or not liking and just take better care of herself.

## 2015-08-15 ENCOUNTER — Other Ambulatory Visit: Payer: Self-pay | Admitting: Family Medicine

## 2015-09-12 ENCOUNTER — Other Ambulatory Visit: Payer: Self-pay | Admitting: Family Medicine

## 2015-09-12 DIAGNOSIS — N6001 Solitary cyst of right breast: Secondary | ICD-10-CM

## 2015-10-15 ENCOUNTER — Other Ambulatory Visit: Payer: Self-pay | Admitting: Family Medicine

## 2015-10-15 DIAGNOSIS — N6001 Solitary cyst of right breast: Secondary | ICD-10-CM

## 2015-10-20 ENCOUNTER — Other Ambulatory Visit: Payer: Self-pay | Admitting: Family Medicine

## 2015-10-22 ENCOUNTER — Ambulatory Visit
Admission: RE | Admit: 2015-10-22 | Discharge: 2015-10-22 | Disposition: A | Discharge status: Home / Self Care | Payer: Medicare Other | Source: Ambulatory Visit | Attending: Family Medicine | Admitting: Family Medicine

## 2015-10-22 DIAGNOSIS — N6001 Solitary cyst of right breast: Secondary | ICD-10-CM

## 2015-11-09 ENCOUNTER — Encounter: Payer: Self-pay | Admitting: Family Medicine

## 2015-11-09 ENCOUNTER — Ambulatory Visit (INDEPENDENT_AMBULATORY_CARE_PROVIDER_SITE_OTHER): Payer: Medicare Other | Admitting: Family Medicine

## 2015-11-09 VITALS — BP 150/88 | HR 84 | Ht 70.0 in | Wt >= 6400 oz

## 2015-11-09 DIAGNOSIS — E1136 Type 2 diabetes mellitus with diabetic cataract: Secondary | ICD-10-CM

## 2015-11-09 DIAGNOSIS — Z794 Long term (current) use of insulin: Secondary | ICD-10-CM | POA: Diagnosis not present

## 2015-11-09 DIAGNOSIS — E118 Type 2 diabetes mellitus with unspecified complications: Secondary | ICD-10-CM | POA: Diagnosis not present

## 2015-11-09 DIAGNOSIS — Z23 Encounter for immunization: Secondary | ICD-10-CM

## 2015-11-09 DIAGNOSIS — E1169 Type 2 diabetes mellitus with other specified complication: Secondary | ICD-10-CM

## 2015-11-09 DIAGNOSIS — E1159 Type 2 diabetes mellitus with other circulatory complications: Secondary | ICD-10-CM | POA: Diagnosis not present

## 2015-11-09 DIAGNOSIS — I10 Essential (primary) hypertension: Secondary | ICD-10-CM | POA: Diagnosis not present

## 2015-11-09 DIAGNOSIS — E785 Hyperlipidemia, unspecified: Secondary | ICD-10-CM | POA: Diagnosis not present

## 2015-11-09 DIAGNOSIS — I152 Hypertension secondary to endocrine disorders: Secondary | ICD-10-CM

## 2015-11-09 DIAGNOSIS — E1121 Type 2 diabetes mellitus with diabetic nephropathy: Secondary | ICD-10-CM

## 2015-11-09 LAB — COMPREHENSIVE METABOLIC PANEL
ALT: 22 U/L (ref 6–29)
AST: 36 U/L — ABNORMAL HIGH (ref 10–35)
Albumin: 4.5 g/dL (ref 3.6–5.1)
Alkaline Phosphatase: 90 U/L (ref 33–130)
BUN: 16 mg/dL (ref 7–25)
CHLORIDE: 105 mmol/L (ref 98–110)
CO2: 26 mmol/L (ref 20–31)
Calcium: 10.3 mg/dL (ref 8.6–10.4)
Creat: 0.84 mg/dL (ref 0.50–0.99)
GLUCOSE: 161 mg/dL — AB (ref 65–99)
POTASSIUM: 4.6 mmol/L (ref 3.5–5.3)
Sodium: 140 mmol/L (ref 135–146)
Total Bilirubin: 0.5 mg/dL (ref 0.2–1.2)
Total Protein: 7.4 g/dL (ref 6.1–8.1)

## 2015-11-09 LAB — CBC WITH DIFFERENTIAL/PLATELET
BASOS PCT: 0 %
Basophils Absolute: 0 cells/uL (ref 0–200)
EOS ABS: 176 {cells}/uL (ref 15–500)
Eosinophils Relative: 2 %
HCT: 41.1 % (ref 35.0–45.0)
Hemoglobin: 13.4 g/dL (ref 11.7–15.5)
LYMPHS PCT: 17 %
Lymphs Abs: 1496 cells/uL (ref 850–3900)
MCH: 28.5 pg (ref 27.0–33.0)
MCHC: 32.6 g/dL (ref 32.0–36.0)
MCV: 87.3 fL (ref 80.0–100.0)
MONOS PCT: 5 %
MPV: 10 fL (ref 7.5–12.5)
Monocytes Absolute: 440 cells/uL (ref 200–950)
Neutro Abs: 6688 cells/uL (ref 1500–7800)
Neutrophils Relative %: 76 %
PLATELETS: 294 10*3/uL (ref 140–400)
RBC: 4.71 MIL/uL (ref 3.80–5.10)
RDW: 14 % (ref 11.0–15.0)
WBC: 8.8 10*3/uL (ref 4.0–10.5)

## 2015-11-09 LAB — POCT UA - MICROALBUMIN
Albumin/Creatinine Ratio, Urine, POC: 53.1
Creatinine, POC: 248.4 mg/dL
Microalbumin Ur, POC: 132 mg/L

## 2015-11-09 LAB — POCT GLYCOSYLATED HEMOGLOBIN (HGB A1C): Hemoglobin A1C: 7.3

## 2015-11-09 LAB — LIPID PANEL
CHOL/HDL RATIO: 2.3 ratio (ref <=5.0)
Cholesterol: 144 mg/dL (ref 125–200)
HDL: 63 mg/dL (ref >=46)
LDL CALC: 59 mg/dL (ref <130)
Triglycerides: 110 mg/dL (ref <150)
VLDL: 22 mg/dL (ref <30)

## 2015-11-09 NOTE — Progress Notes (Signed)
Subjective:    Patient ID: Marcia Paul, female    DOB: [birth year], 70 y.o.   MRN: [id number]  Marcia Paul is a 70 y.o. female who presents for follow-up of Type 2 diabetes mellitus.  Patient is checking home blood sugars.   Home blood sugar records: BGs range between 130  and 160 How often is blood sugars being checked: twice daily Current symptoms/problems include none and have been stable. Daily foot checks: yes   Any foot concerns: no Last eye exam: 1 year ago Exercise: Home exercise routine includes walking .5 hrs per day. She states she has made some dietary changes but admits to not exercising regularly. She has a very remote history of smoking and has had no pulmonary issues. No evidence of depression or cognitive difficulty. Her weight is relatively unchanged in the morbidly obese category. The biggest issue she has had this been with dental problems interfering with her eating making her eat more carbohydrates. Does have a history of cataracts and does get regular ophthalmologic evaluation. The following portions of the patient's history were reviewed and updated as appropriate: allergies, current medications, past medical history, past social history and problem list.  ROS as in subjective above.     Objective:    Physical Exam Alert and in no distress otherwise not examined.   Lab Review Diabetic Labs Latest Ref Rng & Units 07/23/2015 06/01/2015 03/22/2015 10/17/2014 05/16/2014  HbA1c - 7.7 8.9(H) 9.2 7.9 7.3  Chol 125 - 200 mg/dL - - - 409 -  HDL >=81 mg/dL - - - 61 -  Calc LDL <191 mg/dL - - - 56 -  Triglycerides <150 mg/dL - - - 478 -  Creatinine 0.44 - 1.00 mg/dL - 2.95 - 6.21 -   BP/Weight 07/23/2015 06/07/2015 06/01/2015 03/22/2015 10/17/2014  Systolic BP 130 130 153 120 120  Diastolic BP 84 76 105 78 80  Wt. (Lbs) 408 415 415.35 403.2 409  BMI 58.54 59.55 59.6 57.85 58.69   Foot/eye exam completion dates Latest Ref Rng & Units 10/19/2014 10/17/2014  Eye Exam No  Retinopathy No Retinopathy -  Foot Form Completion - - Done  hemoglobin A1c is 7.3. Microalbumin now places her in the nephropathy category. Analyssa  reports that she quit smoking about 21 months ago. Her smoking use included Cigarettes. She smoked 1.00 pack per day. She has never used smokeless tobacco. She reports that she drinks alcohol. She reports that she does not use drugs.     Assessment & Plan:    Need for prophylactic vaccination and inoculation against influenza - Plan: Flu vaccine HIGH DOSE PF (Fluzone High dose)  Type 2 diabetes mellitus with complication, with long-term current use of insulin (HCC) - Plan: POCT UA - Microalbumin, HgB A1c, CBC with Differential/Platelet, Comprehensive metabolic panel  Hypertension associated with diabetes (HCC) - Plan: CBC with Differential/Platelet, Comprehensive metabolic panel  Diabetic cataract, associated with type 2 diabetes mellitus (HCC)  Morbid obesity, unspecified obesity type (HCC)  Hyperlipidemia associated with type 2 diabetes mellitus (HCC) - Plan: Lipid panel  Diabetic nephropathy associated with type 2 diabetes mellitus (HCC)    1. Rx changes: none 2. Education: Reviewed 'ABCs' of diabetes management (respective goals in parentheses):  A1C (<7), blood pressure (<130/80), and cholesterol (LDL <100). 3. Compliance at present is estimated to be fair. Efforts to improve compliance (if necessary) will be directed at increased exercise. 4. Follow up: 4 months I explained that now she does have evidence of kidney damage  from her diabetes. She is on appropriate medication. Again encouraged her to make further diet and exercise changes. She does state that she plans to do more walking as [place] the [place] for at [place] a month.

## 2016-01-02 ENCOUNTER — Other Ambulatory Visit: Payer: Self-pay | Admitting: Family Medicine

## 2016-01-19 ENCOUNTER — Other Ambulatory Visit: Payer: Self-pay | Admitting: Family Medicine

## 2016-01-19 DIAGNOSIS — E118 Type 2 diabetes mellitus with unspecified complications: Secondary | ICD-10-CM

## 2016-01-19 DIAGNOSIS — Z794 Long term (current) use of insulin: Principal | ICD-10-CM

## 2016-01-22 ENCOUNTER — Other Ambulatory Visit: Payer: Self-pay | Admitting: Family Medicine

## 2016-03-11 ENCOUNTER — Ambulatory Visit: Payer: Medicare Other | Admitting: Family Medicine

## 2016-04-14 ENCOUNTER — Other Ambulatory Visit: Payer: Self-pay | Admitting: Family Medicine

## 2016-04-15 ENCOUNTER — Encounter: Payer: Self-pay | Admitting: Family Medicine

## 2016-04-15 ENCOUNTER — Ambulatory Visit (INDEPENDENT_AMBULATORY_CARE_PROVIDER_SITE_OTHER): Payer: Medicare Other | Admitting: Family Medicine

## 2016-04-15 VITALS — BP 130/90 | HR 73 | Ht 70.0 in | Wt >= 6400 oz

## 2016-04-15 DIAGNOSIS — E785 Hyperlipidemia, unspecified: Secondary | ICD-10-CM | POA: Diagnosis not present

## 2016-04-15 DIAGNOSIS — Z794 Long term (current) use of insulin: Secondary | ICD-10-CM | POA: Diagnosis not present

## 2016-04-15 DIAGNOSIS — E1169 Type 2 diabetes mellitus with other specified complication: Secondary | ICD-10-CM | POA: Diagnosis not present

## 2016-04-15 DIAGNOSIS — E118 Type 2 diabetes mellitus with unspecified complications: Secondary | ICD-10-CM

## 2016-04-15 DIAGNOSIS — Z23 Encounter for immunization: Secondary | ICD-10-CM

## 2016-04-15 DIAGNOSIS — I10 Essential (primary) hypertension: Secondary | ICD-10-CM | POA: Diagnosis not present

## 2016-04-15 DIAGNOSIS — E1159 Type 2 diabetes mellitus with other circulatory complications: Secondary | ICD-10-CM

## 2016-04-15 DIAGNOSIS — I152 Hypertension secondary to endocrine disorders: Secondary | ICD-10-CM

## 2016-04-15 NOTE — Patient Instructions (Signed)
20 minutes of something daily

## 2016-04-15 NOTE — Progress Notes (Signed)
  Subjective:    Patient ID: Marcia Paul, female    DOB: June 17, 1945, 71 y.o.   MRN: [id number]  Marcia Paul is a 71 y.o. female who presents for follow-up of Type 2 diabetes mellitus.  Patient is checking home blood sugars.   Home blood sugar records: 118 to 165 How often is blood sugars being checked: Bid Current symptoms/problems none Daily foot checks: Yes Any foot concerns: none Last eye exam: 11/2015 Exercise: 3 /week She continues on her Levemir, simvastatin, Benicar and having no difficulties with them. She also is taking Janumet. The following portions of the patient's history were reviewed and updated as appropriate: allergies, current medications, past medical history, past social history and problem list.  ROS as in subjective above.     Objective:    Physical Exam Alert and in no distress otherwise not examined.   Lab Review Diabetic Labs Latest Ref Rng & Units 11/09/2015 07/23/2015 06/01/2015 03/22/2015 10/17/2014  HbA1c - 7.3 7.7 8.9(H) 9.2 7.9  Microalbumin mg/L 132.0 - - - -  Micro/Creat Ratio - 53.1 - - - -  Chol 125 - 200 mg/dL 409144 - - - 811137  HDL >=91>=46 mg/dL 63 - - - 61  Calc LDL <478<130 mg/dL 59 - - - 56  Triglycerides <150 mg/dL 295110 - - - 621100  Creatinine 0.50 - 0.99 mg/dL 3.080.84 - 6.570.88 - 8.460.75   BP/Weight 11/09/2015 07/23/2015 06/07/2015 06/01/2015 03/22/2015  Systolic BP 150 130 130 153 120  Diastolic BP 88 84 76 105 78  Wt. (Lbs) 412 408 415 415.35 403.2  BMI 59.12 58.54 59.55 59.6 57.85   Foot/eye exam completion dates Latest Ref Rng & Units 10/19/2014 10/17/2014  Eye Exam No Retinopathy No Retinopathy -  Foot Form Completion - - Done  Hemoglobin A1c is 7.5  Marcia Paul  reports that she quit smoking about 2 years ago. Her smoking use included Cigarettes. She smoked 1.00 pack per day. She has never used smokeless tobacco. She reports that she drinks alcohol. She reports that she does not use drugs.     Assessment & Plan:    1. Rx changes: none 2. Education:  Reviewed 'ABCs' of diabetes management (respective goals in parentheses):  A1C (<7), blood pressure (<130/80), and cholesterol (LDL <100). 3. Compliance at present is estimated to be fair. Efforts to improve compliance (if necessary) will be directed at increased exercise. 4. Follow up: 4 months She is continuing to hold her own and still has not committed to increasing her physical activity. I again  encouraged her to do this.

## 2016-04-21 ENCOUNTER — Telehealth: Payer: Self-pay | Admitting: Family Medicine

## 2016-04-21 NOTE — Telephone Encounter (Signed)
Pt called and said that there has been an issue ordering her Insulin for over 1 year.  We say we send it and pharmacy doesn't receive it.  Advised pt I would advise IT dept.  Called pharmacy [phone] t/w Abby and [facility]  She states they just do not get the complete image from escripts that this happens occasionally not just from us.  Called in Levemir to pharmacy

## 2016-04-23 ENCOUNTER — Encounter: Payer: Self-pay | Admitting: Family Medicine

## 2016-04-28 ENCOUNTER — Other Ambulatory Visit: Payer: Self-pay

## 2016-05-03 ENCOUNTER — Other Ambulatory Visit: Payer: Self-pay | Admitting: Family Medicine

## 2016-05-20 ENCOUNTER — Other Ambulatory Visit: Payer: Self-pay | Admitting: Family Medicine

## 2016-06-29 ENCOUNTER — Other Ambulatory Visit: Payer: Self-pay | Admitting: Family Medicine

## 2016-07-15 ENCOUNTER — Other Ambulatory Visit: Payer: Self-pay | Admitting: Family Medicine

## 2016-07-15 DIAGNOSIS — E118 Type 2 diabetes mellitus with unspecified complications: Secondary | ICD-10-CM

## 2016-07-15 DIAGNOSIS — Z794 Long term (current) use of insulin: Principal | ICD-10-CM

## 2016-07-24 ENCOUNTER — Other Ambulatory Visit: Payer: Self-pay | Admitting: Family Medicine

## 2016-07-24 ENCOUNTER — Telehealth: Payer: Self-pay | Admitting: Family Medicine

## 2016-07-24 NOTE — Telephone Encounter (Signed)
Marcia Paul called, time for refills on her Janumet and Levamir.  There is always an issue with the pharmacy for these. Reviewing the chart they have both been refilled to day.  [facility] talked [name], she shows both refill request and all looks well.

## 2016-08-14 ENCOUNTER — Encounter: Payer: Self-pay | Admitting: Family Medicine

## 2016-08-14 ENCOUNTER — Ambulatory Visit (INDEPENDENT_AMBULATORY_CARE_PROVIDER_SITE_OTHER): Payer: Medicare Other | Admitting: Family Medicine

## 2016-08-14 VITALS — BP 120/70 | HR 80 | Ht 70.0 in | Wt >= 6400 oz

## 2016-08-14 DIAGNOSIS — I10 Essential (primary) hypertension: Secondary | ICD-10-CM | POA: Diagnosis not present

## 2016-08-14 DIAGNOSIS — E1159 Type 2 diabetes mellitus with other circulatory complications: Secondary | ICD-10-CM

## 2016-08-14 DIAGNOSIS — Z794 Long term (current) use of insulin: Secondary | ICD-10-CM

## 2016-08-14 DIAGNOSIS — E1169 Type 2 diabetes mellitus with other specified complication: Secondary | ICD-10-CM

## 2016-08-14 DIAGNOSIS — E785 Hyperlipidemia, unspecified: Secondary | ICD-10-CM

## 2016-08-14 DIAGNOSIS — E118 Type 2 diabetes mellitus with unspecified complications: Secondary | ICD-10-CM | POA: Diagnosis not present

## 2016-08-14 LAB — POCT GLYCOSYLATED HEMOGLOBIN (HGB A1C): Hemoglobin A1C: 7.5

## 2016-08-14 NOTE — Progress Notes (Signed)
  Subjective:    Patient ID: Marcia FieldLinda S Paul, female    DOB: 28-Oct-1945, 71 y.o.   MRN: [id number]  Marcia Paul is a 71 y.o. female who presents for follow-up of Type 2 diabetes mellitus.  Patient is checking home blood sugars.   Home blood sugar records low 130 high 170 How often is blood sugars being checked: bid Current symptoms/problems  Daily foot checks: yes   Any foot concerns: none Last eye exam: 10/19/14 Exercise: working on it. She continues to always allow something to interfere with taking better care of herself in spite of my counseling concerning this She continues on simvastatin and having no aches and pains. She is also taking Benicar for her hypertension and having no trouble with that. She is on stable dosing of Levemir as well as Janumet..  The following portions of the patient's history were reviewed and updated as appropriate: allergies, current medications, past medical history, past social history and problem list.  ROS as in subjective above.     Objective:    Physical Exam Alert and in no distress otherwise not examined.  Blood pressure 120/70, pulse 80, height 5\' 10"  (1.778 m), weight (!) 421 lb (191 kg), SpO2 95 %.  Lab Review Diabetic Labs Latest Ref Rng & Units 08/14/2016 11/09/2015 07/23/2015 06/01/2015 03/22/2015  HbA1c - 7.5 7.3 7.7 8.9(H) 9.2  Microalbumin mg/L - 132.0 - - -  Micro/Creat Ratio - - 53.1 - - -  Chol 125 - 200 mg/dL - 409144 - - -  HDL >=81>=46 mg/dL - 63 - - -  Calc LDL <191<130 mg/dL - 59 - - -  Triglycerides <150 mg/dL - 478110 - - -  Creatinine 0.50 - 0.99 mg/dL - 2.950.84 - 6.210.88 -   BP/Weight 08/14/2016 04/15/2016 11/09/2015 07/23/2015 06/07/2015  Systolic BP 120 130 150 130 130  Diastolic BP 70 90 88 84 76  Wt. (Lbs) 421 418 412 408 415  BMI 60.41 59.98 59.12 58.54 59.55   Foot/eye exam completion dates Latest Ref Rng & Units 10/19/2014 10/17/2014  Eye Exam No Retinopathy No Retinopathy -  Foot Form Completion - - Done  Marcia QuinLinda  reports that she  quit smoking about 2 years ago. Her smoking use included Cigarettes. She smoked 1.00 pack per day. She has never used smokeless tobacco. She reports that she drinks alcohol. She reports that she does not use drugs.  A1c is 7.5    Assessment & Plan:    Type 2 diabetes mellitus with complication, with long-term current use of insulin (HCC) - Plan: HgB A1c  Hypertension associated with diabetes (HCC)  Morbid obesity, unspecified obesity type (HCC)  Hyperlipidemia associated with type 2 diabetes mellitus (HCC)  1. Rx changes: none 2. Education: Reviewed 'ABCs' of diabetes management (respective goals in parentheses):  A1C (<7), blood pressure (<130/80), and cholesterol (LDL <100). 3. Compliance at present is estimated to be fair. Efforts to improve compliance (if necessary) will be directed at increased exercise. Follow up: 4 months her biggest issue is on is going to be her psychological well-being and her inability to put her own health and well-being on an equal footing as everything else. Recheck here in 4 months.

## 2016-08-14 NOTE — Patient Instructions (Signed)
20 minutes of something physical every day. Do the 10,000 steps

## 2016-10-01 ENCOUNTER — Other Ambulatory Visit: Payer: Self-pay | Admitting: Family Medicine

## 2016-10-01 DIAGNOSIS — N6310 Unspecified lump in the right breast, unspecified quadrant: Secondary | ICD-10-CM

## 2016-10-22 ENCOUNTER — Ambulatory Visit
Admission: RE | Admit: 2016-10-22 | Discharge: 2016-10-22 | Disposition: A | Discharge status: Home / Self Care | Payer: Medicare Other | Source: Ambulatory Visit | Attending: Family Medicine | Admitting: Family Medicine

## 2016-10-22 DIAGNOSIS — N6310 Unspecified lump in the right breast, unspecified quadrant: Secondary | ICD-10-CM

## 2016-10-23 ENCOUNTER — Other Ambulatory Visit: Payer: Self-pay | Admitting: Family Medicine

## 2016-10-23 ENCOUNTER — Telehealth: Payer: Self-pay | Admitting: Family Medicine

## 2016-10-23 NOTE — Telephone Encounter (Signed)
Last checked lipids 11/2015. Will send In 90 days only

## 2016-10-23 NOTE — Telephone Encounter (Signed)
Pt left message time for Levamir refill.  Please respond to Vernona Rieger

## 2016-10-24 ENCOUNTER — Other Ambulatory Visit: Payer: Self-pay | Admitting: Family Medicine

## 2016-10-24 MED ORDER — INSULIN DETEMIR 100 UNIT/ML FLEXPEN: PEN_INJECTOR | SUBCUTANEOUS | 1 refills | Status: DC

## 2016-10-24 NOTE — Telephone Encounter (Signed)
Okay to renew

## 2016-10-24 NOTE — Telephone Encounter (Signed)
Called [facility] this morning Levamir was ordered & hadn't came in yet.  Called back and RX was recv'd and went thru insurance for $70.  Called pt and informed.

## 2016-10-31 ENCOUNTER — Other Ambulatory Visit (INDEPENDENT_AMBULATORY_CARE_PROVIDER_SITE_OTHER): Payer: Medicare Other

## 2016-10-31 DIAGNOSIS — Z23 Encounter for immunization: Secondary | ICD-10-CM

## 2016-12-29 ENCOUNTER — Ambulatory Visit (INDEPENDENT_AMBULATORY_CARE_PROVIDER_SITE_OTHER): Payer: Medicare Other | Admitting: Family Medicine

## 2016-12-29 VITALS — BP 120/82 | HR 87 | Wt >= 6400 oz

## 2016-12-29 DIAGNOSIS — Z794 Long term (current) use of insulin: Secondary | ICD-10-CM | POA: Diagnosis not present

## 2016-12-29 DIAGNOSIS — E785 Hyperlipidemia, unspecified: Secondary | ICD-10-CM

## 2016-12-29 DIAGNOSIS — E1121 Type 2 diabetes mellitus with diabetic nephropathy: Secondary | ICD-10-CM | POA: Diagnosis not present

## 2016-12-29 DIAGNOSIS — I10 Essential (primary) hypertension: Secondary | ICD-10-CM

## 2016-12-29 DIAGNOSIS — E1159 Type 2 diabetes mellitus with other circulatory complications: Secondary | ICD-10-CM | POA: Diagnosis not present

## 2016-12-29 DIAGNOSIS — E1169 Type 2 diabetes mellitus with other specified complication: Secondary | ICD-10-CM | POA: Diagnosis not present

## 2016-12-29 DIAGNOSIS — E118 Type 2 diabetes mellitus with unspecified complications: Secondary | ICD-10-CM

## 2016-12-29 DIAGNOSIS — I152 Hypertension secondary to endocrine disorders: Secondary | ICD-10-CM

## 2016-12-29 LAB — CBC WITH DIFFERENTIAL/PLATELET
BASOS PCT: 0.3 %
Basophils Absolute: 27 cells/uL (ref 0–200)
EOS PCT: 1.3 %
Eosinophils Absolute: 116 cells/uL (ref 15–500)
HCT: 41 % (ref 35.0–45.0)
HEMOGLOBIN: 13.3 g/dL (ref 11.7–15.5)
Lymphs Abs: 1593 cells/uL (ref 850–3900)
MCH: 28.2 pg (ref 27.0–33.0)
MCHC: 32.4 g/dL (ref 32.0–36.0)
MCV: 86.9 fL (ref 80.0–100.0)
MONOS PCT: 5 %
MPV: 10.4 fL (ref 7.5–12.5)
NEUTROS ABS: 6720 {cells}/uL (ref 1500–7800)
Neutrophils Relative %: 75.5 %
Platelets: 315 10*3/uL (ref 140–400)
RBC: 4.72 10*6/uL (ref 3.80–5.10)
RDW: 12.8 % (ref 11.0–15.0)
Total Lymphocyte: 17.9 %
WBC mixed population: 445 cells/uL (ref 200–950)
WBC: 8.9 10*3/uL (ref 3.8–10.8)

## 2016-12-29 LAB — COMPREHENSIVE METABOLIC PANEL
AG RATIO: 1.5 (calc) (ref 1.0–2.5)
ALT: 22 U/L (ref 6–29)
AST: 35 U/L (ref 10–35)
Albumin: 4.3 g/dL (ref 3.6–5.1)
Alkaline phosphatase (APISO): 95 U/L (ref 33–130)
BUN: 13 mg/dL (ref 7–25)
CO2: 26 mmol/L (ref 20–32)
CREATININE: 0.95 mg/dL (ref 0.50–0.99)
Calcium: 10.6 mg/dL — ABNORMAL HIGH (ref 8.6–10.4)
Chloride: 102 mmol/L (ref 98–110)
GLOBULIN: 2.8 g/dL (ref 1.9–3.7)
GLUCOSE: 160 mg/dL — AB (ref 65–99)
Potassium: 4.6 mmol/L (ref 3.5–5.3)
SODIUM: 138 mmol/L (ref 135–146)
TOTAL PROTEIN: 7.1 g/dL (ref 6.1–8.1)
Total Bilirubin: 0.4 mg/dL (ref 0.2–1.2)

## 2016-12-29 LAB — LIPID PANEL
CHOL/HDL RATIO: 2.5 (calc) (ref <5.0)
Cholesterol: 145 mg/dL (ref <200)
HDL: 59 mg/dL (ref >50)
LDL CHOLESTEROL (CALC): 63 mg/dL
NON-HDL CHOLESTEROL (CALC): 86 mg/dL (ref <130)
TRIGLYCERIDES: 149 mg/dL (ref <150)

## 2016-12-29 LAB — POCT GLYCOSYLATED HEMOGLOBIN (HGB A1C)

## 2016-12-29 NOTE — Progress Notes (Signed)
  Subjective:    Patient ID: Marcia Paul, female    DOB: October 20, 1945, 71 y.o.   MRN: [id number]  Marcia Paul is a 71 y.o. female who presents for follow-up of Type 2 diabetes mellitus.  Patient is checking home blood sugars.   Home blood sugar records: BGs range between 130 and 160 How often is blood sugars being checked: twice a day Current symptoms/problems include none and have been stable. Daily foot checks: yes   Any foot concerns: none Last eye exam: 2 years ago, needs to make one Exercise: walking 4 times a week  She has made some diet and exercise changes and has lost roughly 20 pounds. She continues on Janumet as well as Levemir. She is also taking simvastatin for her cholesterol and having no difficulty with that. She continues on Benicar/HCTZ.  The following portions of the patient's history were reviewed and updated as appropriate: allergies, current medications, past medical history, past social history and problem list.  ROS as in subjective above.     Objective:    Physical Exam Alert and in no distress otherwise not examined.   Lab Review Diabetic Labs Latest Ref Rng & Units 08/14/2016 11/09/2015 07/23/2015 06/01/2015 03/22/2015  HbA1c - 7.5 7.3 7.7 8.9(H) 9.2  Microalbumin mg/L - 132.0 - - -  Micro/Creat Ratio - - 53.1 - - -  Chol 125 - 200 mg/dL - 782144 - - -  HDL >=95>=46 mg/dL - 63 - - -  Calc LDL <621<130 mg/dL - 59 - - -  Triglycerides <150 mg/dL - 308110 - - -  Creatinine 0.50 - 0.99 mg/dL - 6.570.84 - 8.460.88 -   BP/Weight 08/14/2016 04/15/2016 11/09/2015 07/23/2015 06/07/2015  Systolic BP 120 130 150 130 130  Diastolic BP 70 90 88 84 76  Wt. (Lbs) 421 418 412 408 415  BMI 60.41 59.98 59.12 58.54 59.55   Foot/eye exam completion dates Latest Ref Rng & Units 10/19/2014 10/17/2014  Eye Exam No Retinopathy No Retinopathy -  Foot Form Completion - - Done  A1c is 6.9 Foot exam is normal  Marcia QuinLinda  reports that she quit smoking about 2 years ago. Her smoking use included  Cigarettes. She smoked 1.00 pack per day. She has never used smokeless tobacco. She reports that she drinks alcohol. She reports that she does not use drugs.     Assessment & Plan:    1. Rx changes: none 2. Education: Reviewed 'ABCs' of diabetes management (respective goals in parentheses):  A1C (<7), blood pressure (<130/80), and cholesterol (LDL <100). 3. Compliance at present is estimated to be good. Efforts to improve compliance (if necessary) will be directed at increased exercise. 4. Follow up: 4 months Again discussed the fact that she seems to be making progress but to make asked I'll change her main concern and not necessarily weight loss. She seemed understand that concept. Encouraged her to continue with her present regimen and it she reaches a plateau, don't be discouraged and if further questions only.

## 2016-12-29 NOTE — Patient Instructions (Signed)
Weight loss should only be used as a measure of how well you have made lifestyle changes

## 2017-01-06 ENCOUNTER — Other Ambulatory Visit: Payer: Self-pay | Admitting: Family Medicine

## 2017-01-06 ENCOUNTER — Telehealth: Payer: Self-pay | Admitting: Family Medicine

## 2017-01-06 NOTE — Telephone Encounter (Signed)
[facility] requesting a new One Touch Ultra meter for pt since her meter broke

## 2017-01-07 MED ORDER — BLOOD GLUCOSE MONITOR KIT: PACK | 0 refills | Status: DC

## 2017-01-07 NOTE — Telephone Encounter (Signed)
Order called in. Trixie Rude/RLB

## 2017-01-20 ENCOUNTER — Other Ambulatory Visit: Payer: Self-pay | Admitting: Family Medicine

## 2017-01-20 DIAGNOSIS — E118 Type 2 diabetes mellitus with unspecified complications: Secondary | ICD-10-CM

## 2017-01-20 DIAGNOSIS — Z794 Long term (current) use of insulin: Principal | ICD-10-CM

## 2017-02-02 ENCOUNTER — Telehealth: Payer: Self-pay | Admitting: Family Medicine

## 2017-02-02 NOTE — Telephone Encounter (Signed)
[facility] req One Touch Ultra glucose system

## 2017-02-02 NOTE — Telephone Encounter (Signed)
Spoke with pt- she already has meter and all supplies- no further refills needed per pt. Trixie Rude/RLB

## 2017-04-02 LAB — HM DIABETES EYE EXAM

## 2017-04-05 ENCOUNTER — Other Ambulatory Visit: Payer: Self-pay | Admitting: Family Medicine

## 2017-04-29 ENCOUNTER — Other Ambulatory Visit: Payer: Self-pay | Admitting: Medical

## 2017-04-29 ENCOUNTER — Other Ambulatory Visit: Payer: Self-pay | Admitting: Family Medicine

## 2017-04-30 ENCOUNTER — Ambulatory Visit: Payer: Medicare Other | Admitting: Family Medicine

## 2017-05-19 ENCOUNTER — Other Ambulatory Visit: Payer: Self-pay | Admitting: Family Medicine

## 2017-05-19 ENCOUNTER — Ambulatory Visit: Payer: Medicare Other | Admitting: Family Medicine

## 2017-05-25 ENCOUNTER — Telehealth: Payer: Self-pay | Admitting: Family Medicine

## 2017-05-25 NOTE — Telephone Encounter (Signed)
P.A. POLYETHYLENE GLYCOL (Miralax)  Called pt and she states she has been on this since GI 02/08/14 for chronic constipation, has tried stool softeners and laxatives in the past.

## 2017-06-07 NOTE — Telephone Encounter (Signed)
P.A. Denied, Medicare does not pay for any medications that are over the counter, no option of appeal, pt informed

## 2017-06-29 ENCOUNTER — Ambulatory Visit: Payer: Medicare Other | Admitting: Family Medicine

## 2017-06-29 ENCOUNTER — Encounter: Payer: Self-pay | Admitting: Family Medicine

## 2017-06-29 VITALS — BP 132/84 | HR 98 | Temp 97.7°F | Ht 67.0 in | Wt >= 6400 oz

## 2017-06-29 DIAGNOSIS — Z794 Long term (current) use of insulin: Secondary | ICD-10-CM | POA: Diagnosis not present

## 2017-06-29 DIAGNOSIS — E1159 Type 2 diabetes mellitus with other circulatory complications: Secondary | ICD-10-CM | POA: Diagnosis not present

## 2017-06-29 DIAGNOSIS — E118 Type 2 diabetes mellitus with unspecified complications: Secondary | ICD-10-CM | POA: Diagnosis not present

## 2017-06-29 DIAGNOSIS — E1169 Type 2 diabetes mellitus with other specified complication: Secondary | ICD-10-CM

## 2017-06-29 DIAGNOSIS — I10 Essential (primary) hypertension: Secondary | ICD-10-CM

## 2017-06-29 DIAGNOSIS — E785 Hyperlipidemia, unspecified: Secondary | ICD-10-CM | POA: Diagnosis not present

## 2017-06-29 LAB — POCT GLYCOSYLATED HEMOGLOBIN (HGB A1C): HEMOGLOBIN A1C: 7.7

## 2017-06-29 NOTE — Progress Notes (Signed)
Subjective:    Patient ID: Marcia Paul, female    DOB: 10-07-1945, 72 y.o.   MRN: [id number]  Marcia Paul is a 72 y.o. female who presents for follow-up of Type 2 diabetes mellitus.  Patient {is checking home blood sugars.   Home blood sugar records:both paper and meter records How often is blood sugars being checked: twice a day.  States that her sugars are running in the 160 range but when she has some under control there in the low 130 range. Current symptoms/problems include none and have been unchanged. Daily foot checks: yes   Any foot concerns: no Last eye exam: 2019 Exercise: walking but has slowed down due to illness. She continues on Janumet she continues on home losartan/HCTZ and simvastatin and having no difficulty with that.  She is also taking multiple over-the-counter preparations. The following portions of the patient's history were reviewed and updated as appropriate: allergies, current medications, past medical history, past social history and problem list.  ROS as in subjective above.     Objective:    Physical Exam Alert and in no distress foot exam is recorded and is normal   Lab Review Diabetic Labs Latest Ref Rng & Units 12/29/2016 08/14/2016 11/09/2015 07/23/2015 06/01/2015  HbA1c - 6.9% 7.5 7.3 7.7 8.9(H)  Microalbumin mg/L - - 132.0 - -  Micro/Creat Ratio - - - 53.1 - -  Chol <200 mg/dL 784 - 696 - -  HDL >29 mg/dL 59 - 63 - -  Calc LDL mg/dL (calc) 63 - 59 - -  Triglycerides <150 mg/dL 528 - 413 - -  Creatinine 0.50 - 0.99 mg/dL 2.44 - 0.10 - 2.72   BP/Weight 12/29/2016 08/14/2016 04/15/2016 11/09/2015 07/23/2015  Systolic BP 120 120 130 150 130  Diastolic BP 82 70 90 88 84  Wt. (Lbs) 403.8 421 418 412 408  BMI 57.94 60.41 59.98 59.12 58.54   Foot/eye exam completion dates Latest Ref Rng & Units 04/02/2017 12/29/2016  Eye Exam No Retinopathy No Retinopathy -  Foot Form Completion - - Done  A1c is 7.7 Marcia Paul  reports that she quit smoking about 3  years ago. Her smoking use included cigarettes. She smoked 1.00 pack per day. She has never used smokeless tobacco. She reports that she drinks alcohol. She reports that she does not use drugs.     Assessment & Plan:    Type 2 diabetes mellitus with complication, with long-term current use of insulin (HCC)  Hypertension associated with diabetes (HCC)  Morbid obesity, unspecified obesity type (HCC)  Hyperlipidemia associated with type 2 diabetes mellitus (HCC)    1. Rx changes: The record indicates that she really has been unable to make any major changes in her lifestyle in spite of me constantly working with her. 2. I discussed increasing her insulin dosing however she would prefer to work on increasing her physical activity which I strongly encouraged her to do.  Education: Reviewed 'ABCs' of diabetes management (respective goals in parentheses):  A1C (<7), blood pressure (<130/80), and cholesterol (LDL <100). 3. Compliance at present is estimated to be poor. Efforts to improve compliance (if necessary) will be directed at increased exercise. 4. Follow up: 4 months She continues to always put everybody else in her family's wants needs and desires above her own even though she realizes that this is not appropriate.  I have tried for years to get her to recognize that she has value and she does recognize this but has yet to  been able to make any major changes.

## 2017-06-29 NOTE — Addendum Note (Signed)
Addended by: Renelda Loma on: 06/29/2017 02:14 PM   Modules accepted: Orders

## 2017-06-29 NOTE — Patient Instructions (Addendum)
Increase your insulin dosing by 2 units every 2 days until your morning blood sugar is under 120 Treat yourself like you treat everybody else

## 2017-07-23 ENCOUNTER — Other Ambulatory Visit: Payer: Self-pay | Admitting: Medical

## 2017-07-23 ENCOUNTER — Other Ambulatory Visit: Payer: Self-pay | Admitting: Family Medicine

## 2017-07-23 DIAGNOSIS — Z794 Long term (current) use of insulin: Principal | ICD-10-CM

## 2017-07-23 DIAGNOSIS — E118 Type 2 diabetes mellitus with unspecified complications: Secondary | ICD-10-CM

## 2017-09-18 ENCOUNTER — Other Ambulatory Visit: Payer: Self-pay | Admitting: Family Medicine

## 2017-09-18 DIAGNOSIS — Z1231 Encounter for screening mammogram for malignant neoplasm of breast: Secondary | ICD-10-CM

## 2017-10-23 ENCOUNTER — Ambulatory Visit
Admission: RE | Admit: 2017-10-23 | Discharge: 2017-10-23 | Disposition: A | Discharge status: Home / Self Care | Payer: Medicare Other | Source: Ambulatory Visit | Attending: Family Medicine | Admitting: Family Medicine

## 2017-10-23 DIAGNOSIS — Z1231 Encounter for screening mammogram for malignant neoplasm of breast: Secondary | ICD-10-CM

## 2017-11-03 ENCOUNTER — Encounter: Payer: Self-pay | Admitting: Family Medicine

## 2017-11-03 ENCOUNTER — Ambulatory Visit: Payer: Medicare Other | Admitting: Family Medicine

## 2017-11-03 VITALS — BP 130/88 | HR 82 | Temp 98.0°F | Wt 396.8 lb

## 2017-11-03 DIAGNOSIS — E118 Type 2 diabetes mellitus with unspecified complications: Secondary | ICD-10-CM | POA: Diagnosis not present

## 2017-11-03 DIAGNOSIS — E1169 Type 2 diabetes mellitus with other specified complication: Secondary | ICD-10-CM

## 2017-11-03 DIAGNOSIS — E1159 Type 2 diabetes mellitus with other circulatory complications: Secondary | ICD-10-CM | POA: Diagnosis not present

## 2017-11-03 DIAGNOSIS — Z794 Long term (current) use of insulin: Secondary | ICD-10-CM

## 2017-11-03 DIAGNOSIS — E785 Hyperlipidemia, unspecified: Secondary | ICD-10-CM

## 2017-11-03 DIAGNOSIS — I10 Essential (primary) hypertension: Secondary | ICD-10-CM

## 2017-11-03 LAB — POCT GLYCOSYLATED HEMOGLOBIN (HGB A1C): Hemoglobin A1C: 7 % — AB (ref 4.0–5.6)

## 2017-11-03 MED ORDER — INSULIN DETEMIR 100 UNIT/ML FLEXPEN: PEN_INJECTOR | SUBCUTANEOUS | 1 refills | Status: DC

## 2017-11-03 MED ORDER — SIMVASTATIN 40 MG PO TABS: 40.0000 mg | ORAL_TABLET | Freq: Every day | ORAL | 1 refills | Status: DC

## 2017-11-03 MED ORDER — OLMESARTAN MEDOXOMIL-HCTZ 40-25 MG PO TABS: 1.0000 | ORAL_TABLET | Freq: Every day | ORAL | 1 refills | Status: DC

## 2017-11-03 NOTE — Progress Notes (Signed)
  Subjective:    Patient ID: Marcia Paul, female    DOB: [birth year], 72 y.o.   MRN: [id number]  Marcia Paul is a 72 y.o. female who presents for follow-up of Type 2 diabetes mellitus.  Patient is  checking home blood sugars.   Home blood sugar records: paper record How often is blood sugars being checked: bid Current symptoms/problems include none and have been unchanged. Daily foot checks: yes    Any foot concerns: none  Last eye exam: 2019 Exercise: walking 20 min QD she finds it is easier to do not she is lost approximately 16 pounds.  Much less knee pain.  She is also sleeping much better.  She has been checking her blood pressure at home and states that it is in the normal range.  She has had a recent mammogram.  She had an eye exam in January.  Continues on olmesartan/HCTZ, simvastatin.  She is also taking insulin as well as Janumet.  She feels very good about her weight loss.  The following portions of the patient's history were reviewed and updated as appropriate: allergies, current medications, past medical history, past social history and problem list.  ROS as in subjective above.     Objective:    Physical Exam Alert and in no distress otherwise not examined.  A1c is 7.0 Lab Review Diabetic Labs Latest Ref Rng & Units 06/29/2017 12/29/2016 08/14/2016 11/09/2015 07/23/2015  HbA1c - 7.7 6.9% 7.5 7.3 7.7  Microalbumin mg/L - - - 132.0 -  Micro/Creat Ratio - - - - 53.1 -  Chol <200 mg/dL - 038 - 882 -  HDL >80 mg/dL - 59 - 63 -  Calc LDL mg/dL (calc) - 63 - 59 -  Triglycerides <150 mg/dL - 034 - 917 -  Creatinine 0.50 - 0.99 mg/dL - 9.15 - 0.56 -   BP/Weight 06/29/2017 12/29/2016 08/14/2016 04/15/2016 11/09/2015  Systolic BP 132 120 120 130 150  Diastolic BP 84 82 70 90 88  Wt. (Lbs) 412.8 403.8 421 418 412  BMI 64.65 57.94 60.41 59.98 59.12   Foot/eye exam completion dates Latest Ref Rng & Units 06/29/2017 04/02/2017  Eye Exam No Retinopathy - No Retinopathy  Foot Form  Completion - Done -    [name]  reports that she quit smoking about 3 years ago. Her smoking use included cigarettes. She smoked 1.00 pack per day. She has never used smokeless tobacco. She reports that she drinks alcohol. She reports that she does not use drugs.     Assessment & Plan:    Type 2 diabetes mellitus with complication, with long-term current use of insulin (HCC) - Plan: POCT glycosylated hemoglobin (Hb A1C), Insulin Detemir (LEVEMIR FLEXTOUCH) 100 UNIT/ML Pen  Hypertension associated with diabetes (HCC) - Plan: olmesartan-hydrochlorothiazide (BENICAR HCT) 40-25 MG tablet  Hyperlipidemia associated with type 2 diabetes mellitus (HCC) - Plan: simvastatin (ZOCOR) 40 MG tablet  Obesity, morbid (more than 100 lbs over ideal weight or BMI > 40) (HCC)     1. Rx changes: none 2. Education: Reviewed 'ABCs' of diabetes management (respective goals in parentheses):  A1C (<7), blood pressure (<130/80), and cholesterol (LDL <100). 3. Compliance at present is estimated to be excellent. Efforts to improve compliance (if necessary) will be directed at increased exercise. 4. Follow up: 4 months I congratulated her on progress that she has made.  Recheck here in 4 months.  We will draw blood at that time

## 2017-11-04 ENCOUNTER — Other Ambulatory Visit: Payer: Self-pay | Admitting: Family Medicine

## 2018-01-04 ENCOUNTER — Other Ambulatory Visit (INDEPENDENT_AMBULATORY_CARE_PROVIDER_SITE_OTHER): Payer: Medicare Other

## 2018-01-04 DIAGNOSIS — Z23 Encounter for immunization: Secondary | ICD-10-CM

## 2018-01-10 ENCOUNTER — Other Ambulatory Visit: Payer: Self-pay | Admitting: Family Medicine

## 2018-01-27 ENCOUNTER — Other Ambulatory Visit: Payer: Self-pay | Admitting: Family Medicine

## 2018-01-27 DIAGNOSIS — Z794 Long term (current) use of insulin: Principal | ICD-10-CM

## 2018-01-27 DIAGNOSIS — E118 Type 2 diabetes mellitus with unspecified complications: Secondary | ICD-10-CM

## 2018-03-05 ENCOUNTER — Ambulatory Visit: Payer: Medicare Other | Admitting: Family Medicine

## 2018-03-22 ENCOUNTER — Other Ambulatory Visit: Payer: Self-pay | Admitting: Family Medicine

## 2018-03-22 DIAGNOSIS — I152 Hypertension secondary to endocrine disorders: Secondary | ICD-10-CM

## 2018-03-22 DIAGNOSIS — E1159 Type 2 diabetes mellitus with other circulatory complications: Secondary | ICD-10-CM

## 2018-03-22 DIAGNOSIS — I10 Essential (primary) hypertension: Principal | ICD-10-CM

## 2018-03-30 ENCOUNTER — Encounter: Payer: Self-pay | Admitting: Family Medicine

## 2018-03-30 ENCOUNTER — Ambulatory Visit: Payer: Medicare Other | Admitting: Family Medicine

## 2018-03-30 VITALS — BP 134/82 | HR 73 | Temp 97.8°F | Wt 395.2 lb

## 2018-03-30 DIAGNOSIS — E1169 Type 2 diabetes mellitus with other specified complication: Secondary | ICD-10-CM | POA: Diagnosis not present

## 2018-03-30 DIAGNOSIS — I152 Hypertension secondary to endocrine disorders: Secondary | ICD-10-CM

## 2018-03-30 DIAGNOSIS — E1159 Type 2 diabetes mellitus with other circulatory complications: Secondary | ICD-10-CM | POA: Diagnosis not present

## 2018-03-30 DIAGNOSIS — E118 Type 2 diabetes mellitus with unspecified complications: Secondary | ICD-10-CM

## 2018-03-30 DIAGNOSIS — E785 Hyperlipidemia, unspecified: Secondary | ICD-10-CM

## 2018-03-30 DIAGNOSIS — Z794 Long term (current) use of insulin: Secondary | ICD-10-CM | POA: Diagnosis not present

## 2018-03-30 DIAGNOSIS — I10 Essential (primary) hypertension: Secondary | ICD-10-CM

## 2018-03-30 LAB — POCT GLYCOSYLATED HEMOGLOBIN (HGB A1C): Hemoglobin A1C: 6.7 % — AB (ref 4.0–5.6)

## 2018-03-30 NOTE — Progress Notes (Signed)
  Subjective:    Patient ID: Marcia Paul, female    DOB: 1945-09-13, 73 y.o.   MRN: [id number]  Marcia Paul is a 73 y.o. female who presents for follow-up of Type 2 diabetes mellitus.  Patient is checking home blood sugars.   Home blood sugar records: meter records and log kept How often is blood sugars being checked: bid fasting and 2 hours post meal 118 - 135 Current symptoms/problems include cramping in legs on and off. Daily foot checks: yes   Any foot concerns: none Last eye exam: Due next month Exercise: 15 minutes a day She continues on insulin as well as Janumet for her blood sugar.  She is also taking olmesartan/HCTZ as well as simvastatin and having no difficulty with them.  She is also using over-the-counter omega-3. The following portions of the patient's history were reviewed and updated as appropriate: allergies, current medications, past medical history, past social history and problem list.  ROS as in subjective above.     Objective:    Physical Exam Alert and in no distress otherwise not examined.  A1c is 6.7 Lab Review Diabetic Labs Latest Ref Rng & Units 11/03/2017 06/29/2017 12/29/2016 08/14/2016 11/09/2015  HbA1c 4.0 - 5.6 % 7.0(A) 7.7 6.9% 7.5 7.3  Microalbumin mg/L - - - - 132.0  Micro/Creat Ratio - - - - - 53.1  Chol <200 mg/dL - - 657 - 846  HDL >96 mg/dL - - 59 - 63  Calc LDL mg/dL (calc) - - 63 - 59  Triglycerides <150 mg/dL - - 295 - 284  Creatinine 0.50 - 0.99 mg/dL - - 1.32 - 4.40   BP/Weight 11/03/2017 06/29/2017 12/29/2016 08/14/2016 04/15/2016  Systolic BP 130 132 120 120 130  Diastolic BP 88 84 82 70 90  Wt. (Lbs) 396.8 412.8 403.8 421 418  BMI 62.15 64.65 57.94 60.41 59.98   Foot/eye exam completion dates Latest Ref Rng & Units 06/29/2017 04/02/2017  Eye Exam No Retinopathy - No Retinopathy  Foot Form Completion - Done -    Marcia Paul  reports that she quit smoking about 4 years ago. Her smoking use included cigarettes. She smoked 1.00 pack per  day. She has never used smokeless tobacco. She reports current alcohol use. She reports that she does not use drugs.     Assessment & Plan:    Type 2 diabetes mellitus with complication, with long-term current use of insulin (HCC) - Plan: CBC with Differential/Platelet, Comprehensive metabolic panel, Lipid panel, POCT UA - Microalbumin  Hypertension associated with diabetes (HCC) - Plan: CBC with Differential/Platelet, Comprehensive metabolic panel  Hyperlipidemia associated with type 2 diabetes mellitus (HCC) - Plan: Lipid panel  Morbid obesity, unspecified obesity type (HCC) - Plan: CBC with Differential/Platelet, Comprehensive metabolic panel, Lipid panel   1. Rx changes: none 2. Education: Reviewed 'ABCs' of diabetes management (respective goals in parentheses):  A1C (<7), blood pressure (<130/80), and cholesterol (LDL <100). 3. Compliance at present is estimated to be good. Efforts to improve compliance (if necessary) will be directed at increased exercise. 4. Follow up: 4 months I again encouraged her to continue to make diet and exercise changes.  Discussed the fact that with weight loss we could potentially get her off of insulin entirely.

## 2018-03-31 LAB — COMPREHENSIVE METABOLIC PANEL
ALT: 17 IU/L (ref 0–32)
AST: 33 IU/L (ref 0–40)
Albumin/Globulin Ratio: 1.8 (ref 1.2–2.2)
Albumin: 4.6 g/dL (ref 3.8–4.8)
Alkaline Phosphatase: 102 IU/L (ref 39–117)
BILIRUBIN TOTAL: 0.4 mg/dL (ref 0.0–1.2)
BUN/Creatinine Ratio: 22 (ref 12–28)
BUN: 20 mg/dL (ref 8–27)
CALCIUM: 10.6 mg/dL — AB (ref 8.7–10.3)
CHLORIDE: 102 mmol/L (ref 96–106)
CO2: 24 mmol/L (ref 20–29)
CREATININE: 0.93 mg/dL (ref 0.57–1.00)
GFR calc Af Amer: 72 mL/min/{1.73_m2} (ref >59)
GFR, EST NON AFRICAN AMERICAN: 62 mL/min/{1.73_m2} (ref >59)
GLUCOSE: 128 mg/dL — AB (ref 65–99)
Globulin, Total: 2.6 g/dL (ref 1.5–4.5)
Potassium: 4.8 mmol/L (ref 3.5–5.2)
Sodium: 140 mmol/L (ref 134–144)
TOTAL PROTEIN: 7.2 g/dL (ref 6.0–8.5)

## 2018-03-31 LAB — CBC WITH DIFFERENTIAL/PLATELET
BASOS ABS: 0 10*3/uL (ref 0.0–0.2)
Basos: 1 %
EOS (ABSOLUTE): 0.1 10*3/uL (ref 0.0–0.4)
Eos: 1 %
HEMOGLOBIN: 13.2 g/dL (ref 11.1–15.9)
Hematocrit: 40.6 % (ref 34.0–46.6)
IMMATURE GRANS (ABS): 0 10*3/uL (ref 0.0–0.1)
IMMATURE GRANULOCYTES: 0 %
LYMPHS: 16 %
Lymphocytes Absolute: 1.3 10*3/uL (ref 0.7–3.1)
MCH: 28.8 pg (ref 26.6–33.0)
MCHC: 32.5 g/dL (ref 31.5–35.7)
MCV: 89 fL (ref 79–97)
MONOCYTES: 6 %
Monocytes Absolute: 0.5 10*3/uL (ref 0.1–0.9)
NEUTROS PCT: 76 %
Neutrophils Absolute: 6.4 10*3/uL (ref 1.4–7.0)
PLATELETS: 297 10*3/uL (ref 150–450)
RBC: 4.59 x10E6/uL (ref 3.77–5.28)
RDW: 12.9 % (ref 11.7–15.4)
WBC: 8.4 10*3/uL (ref 3.4–10.8)

## 2018-03-31 LAB — LIPID PANEL
Chol/HDL Ratio: 2.2 ratio (ref 0.0–4.4)
Cholesterol, Total: 134 mg/dL (ref 100–199)
HDL: 62 mg/dL (ref >39)
LDL CALC: 56 mg/dL (ref 0–99)
Triglycerides: 79 mg/dL (ref 0–149)
VLDL CHOLESTEROL CAL: 16 mg/dL (ref 5–40)

## 2018-04-05 LAB — HM DIABETES EYE EXAM

## 2018-05-17 ENCOUNTER — Other Ambulatory Visit: Payer: Self-pay | Admitting: Family Medicine

## 2018-05-17 DIAGNOSIS — E118 Type 2 diabetes mellitus with unspecified complications: Secondary | ICD-10-CM

## 2018-05-17 DIAGNOSIS — Z794 Long term (current) use of insulin: Principal | ICD-10-CM

## 2018-05-24 ENCOUNTER — Other Ambulatory Visit: Payer: Self-pay | Admitting: Family Medicine

## 2018-05-24 DIAGNOSIS — E785 Hyperlipidemia, unspecified: Principal | ICD-10-CM

## 2018-05-24 DIAGNOSIS — E1169 Type 2 diabetes mellitus with other specified complication: Secondary | ICD-10-CM

## 2018-06-18 ENCOUNTER — Ambulatory Visit: Payer: Self-pay | Admitting: Family Medicine

## 2018-07-20 ENCOUNTER — Ambulatory Visit: Payer: Medicare Other | Admitting: Family Medicine

## 2018-08-03 ENCOUNTER — Other Ambulatory Visit: Payer: Self-pay | Admitting: Family Medicine

## 2018-08-14 ENCOUNTER — Other Ambulatory Visit: Payer: Self-pay | Admitting: Family Medicine

## 2018-08-14 DIAGNOSIS — E118 Type 2 diabetes mellitus with unspecified complications: Secondary | ICD-10-CM

## 2018-08-18 ENCOUNTER — Ambulatory Visit: Payer: Medicare Other | Admitting: Family Medicine

## 2018-08-19 ENCOUNTER — Telehealth: Payer: Self-pay | Admitting: Family Medicine

## 2018-08-19 NOTE — Telephone Encounter (Signed)
  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 They have told them to stay home until surgery on Monday

## 2018-08-19 NOTE — Telephone Encounter (Signed)
She needs an appt to determine the issue

## 2018-08-19 NOTE — Telephone Encounter (Signed)
Left message for patient letting her know that Dr Redmond School said she needs an appt.

## 2018-09-15 ENCOUNTER — Ambulatory Visit: Payer: Medicare Other | Admitting: Family Medicine

## 2018-09-16 ENCOUNTER — Other Ambulatory Visit: Payer: Self-pay | Admitting: Internal Medicine

## 2018-10-18 ENCOUNTER — Encounter: Payer: Self-pay | Admitting: Family Medicine

## 2018-10-18 ENCOUNTER — Ambulatory Visit: Payer: Medicare Other | Admitting: Family Medicine

## 2018-10-18 ENCOUNTER — Other Ambulatory Visit: Payer: Self-pay

## 2018-10-18 VITALS — BP 130/82 | HR 47 | Temp 98.6°F | Ht 67.0 in | Wt >= 6400 oz

## 2018-10-18 DIAGNOSIS — E785 Hyperlipidemia, unspecified: Secondary | ICD-10-CM

## 2018-10-18 DIAGNOSIS — Z794 Long term (current) use of insulin: Secondary | ICD-10-CM

## 2018-10-18 DIAGNOSIS — N029 Recurrent and persistent hematuria with unspecified morphologic changes: Secondary | ICD-10-CM

## 2018-10-18 DIAGNOSIS — E118 Type 2 diabetes mellitus with unspecified complications: Secondary | ICD-10-CM | POA: Diagnosis not present

## 2018-10-18 DIAGNOSIS — E1159 Type 2 diabetes mellitus with other circulatory complications: Secondary | ICD-10-CM | POA: Diagnosis not present

## 2018-10-18 DIAGNOSIS — E1169 Type 2 diabetes mellitus with other specified complication: Secondary | ICD-10-CM | POA: Diagnosis not present

## 2018-10-18 DIAGNOSIS — Z23 Encounter for immunization: Secondary | ICD-10-CM

## 2018-10-18 DIAGNOSIS — E1136 Type 2 diabetes mellitus with diabetic cataract: Secondary | ICD-10-CM

## 2018-10-18 DIAGNOSIS — Z6379 Other stressful life events affecting family and household: Secondary | ICD-10-CM

## 2018-10-18 DIAGNOSIS — I10 Essential (primary) hypertension: Secondary | ICD-10-CM

## 2018-10-18 DIAGNOSIS — I152 Hypertension secondary to endocrine disorders: Secondary | ICD-10-CM

## 2018-10-18 LAB — POCT GLYCOSYLATED HEMOGLOBIN (HGB A1C): Hemoglobin A1C: 7.5 % — AB (ref 4.0–5.6)

## 2018-10-18 NOTE — Patient Instructions (Addendum)
  Ms. Yeagle , Thank you for taking time to come for your Medicare Wellness Visit. I appreciate your ongoing commitment to your health goals. Please review the following plan we discussed and let me know if I can assist you in the future.   These are the goals we discussed: Try the simvastatin again and if your aches and pains recur go ahead and stop them and then call me.Increase your walking This is a list of the screening recommended for you and due dates:  Health Maintenance  Topic Date Due  . Pneumonia vaccines (2 of 2 - PPSV23) 04/15/2017  . Hemoglobin A1C  09/28/2018  . Flu Shot  10/02/2018  . Complete foot exam   03/31/2019  . Eye exam for diabetics  04/06/2019  . Mammogram  10/24/2019  . Colon Cancer Screening  02/09/2024  . Tetanus Vaccine  04/30/2026  . DEXA scan (bone density measurement)  Completed  .  Hepatitis C: One time screening is recommended by Center for Disease Control  (CDC) for  adults born from [birth year] through 1963.   Completed

## 2018-10-18 NOTE — Addendum Note (Signed)
Addended by: Elyse Jarvis on: 10/18/2018 12:47 PM   Modules accepted: Orders

## 2018-10-18 NOTE — Progress Notes (Signed)
Marcia Paul is a 73 y.o. female who presents for annual wellness visit and follow-up on chronic medical conditions.  She has the following concerns: She has had difficulty with atorvastatin causing muscle aches and pains and then stopped taking the medicine several months ago.  She states that the aches and pains went away.  She has not started taking the medicine again.  She continues on Janumet and Levemir.  She does check her blood sugars periodically.  Her physical activity is quite limited.  She occasionally will check her feet.  She does get regular eye exams.  Her husband was recently diagnosed with bladder cancer and she has been stressed out over this.  She seems to be handling this fairly well. Otherwise things to be doing fairly well for her. Immunizations and Health Maintenance Immunization History  Administered Date(s) Administered  . Influenza Split 01/22/2011  . Influenza Whole 12/30/2005, 12/11/2006  . Influenza, High Dose Seasonal PF 01/11/2014, 11/22/2014, 11/09/2015, 10/31/2016, 01/04/2018  . Influenza, Seasonal, Injecte, Preservative Fre 03/22/2012  . Influenza,inj,Quad PF,6+ Mos 11/23/2012  . Pneumococcal Conjugate-13 04/15/2016  . Pneumococcal Polysaccharide-23 05/21/2005  . Tdap 04/30/2016  . Zoster 01/22/2011  . Zoster Recombinat (Shingrix) 04/30/2016, 10/30/2016   Health Maintenance Due  Topic Date Due  . PNA vac Low Risk Adult (2 of 2 - PPSV23) 04/15/2017  . HEMOGLOBIN A1C  09/28/2018  . INFLUENZA VACCINE  10/02/2018    Last Pap smear: aged out  Last mammogram:10/23/2017 Last colonoscopy:02/08/14 Last DEXA:09/22/2008 Dentist: last year Ophtho: 04/05/2018 Exercise: staying active  Other doctors caring for patient include: N/A  Advanced directives: no. Info given Does Patient Have a Medical Advance Directive?: No Would patient like information on creating a medical advance directive?: Yes (MAU/Ambulatory/Procedural Areas - Information given)  Depression  screen:  See questionnaire below.  [facility] 10/18/2018 11/03/2017 04/15/2016 10/17/2014 03/22/2012  Decreased Interest 0 0 0 0 0  Down, Depressed, Hopeless 0 0 0 0 0  PHQ - 2 Score 0 0 0 0 0    Fall Risk Screen: see questionnaire below. Fall Risk  10/18/2018 11/03/2017 04/15/2016 10/17/2014 07/27/2012  Falls in the past year? 0 No No No No    ADL screen:  See questionnaire below Functional Status Survey: Is the patient deaf or have difficulty hearing?: No Does the patient have difficulty seeing, even when wearing glasses/contacts?: No Does the patient have difficulty concentrating, remembering, or making decisions?: No Does the patient have difficulty walking or climbing stairs?: No Does the patient have difficulty dressing or bathing?: No Does the patient have difficulty doing errands alone such as visiting a doctor's office or shopping?: No   Review of Systems Constitutional: -, -unexpected weight change, -anorexia, -fatigue Allergy: -sneezing, -itching, -congestion Dermatology: denies changing moles, rash, lumps ENT: -runny nose, -ear pain, -sore throat,  Cardiology:  -chest pain, -palpitations, -orthopnea, Respiratory: -cough, -shortness of breath, -dyspnea on exertion, -wheezing,  Gastroenterology: -abdominal pain, -nausea, -vomiting, -diarrhea, -constipation, -dysphagia Hematology: -bleeding or bruising problems Musculoskeletal: -arthralgias, -myalgias, -joint swelling, -back pain, - Ophthalmology: -vision changes,  Urology: -dysuria, -difficulty urinating,  -urinary frequency, -urgency, incontinence Neurology: -, -numbness, , -memory loss, -falls, -dizziness    PHYSICAL EXAM:  BP 130/82 (BP Location: Left Arm, Patient Position: Sitting)   Pulse (!) 47   Temp 98.6 F (37 C)   Ht 5\' 7"  (1.702 m)   Wt (!) 400 lb 3.2 oz (181.5 kg)   SpO2 96%   BMI 62.68 kg/m   General Appearance:  Alert, cooperative, no distress, appears stated age Head: Normocephalic,  without obvious abnormality, atraumatic Eyes: PERRL, conjunctiva/corneas clear, EOM's intact, fundi benign Ears: Normal TM's and external ear canals Nose: Nares normal, mucosa normal, no drainage or sinus tenderness Throat: Lips, mucosa, and tongue normal; teeth and gums normal Neck: Supple, no lymphadenopathy;  thyroid:  no enlargement/tenderness/nodules;Lungs: Clear to auscultation bilaterally without wheezes, rales or ronchi; respirations unlabored Heart: Regular rate and rhythm, S1 and S2 normal, no murmur, rubor gallop Extremities: No clubbing, cyanosis or edema Skin:  Skin color, texture, turgor normal, no rashes or lesions Lymph nodes: Cervical, supraclavicular, and axillary nodes normal Neurologic:  CNII-XII intact, normal strength, sensation and gait; reflexes 2+ and symmetric throughout Psych: Normal mood, affect, hygiene and grooming. Hemoglobin A1c is 7.5 ASSESSMENT/PLAN: Encounter Diagnoses  Name Primary?  . Type 2 diabetes mellitus with complication, with long-term current use of insulin ([place]) Yes  . Hypertension associated with diabetes ([place])   . Hyperlipidemia associated with type 2 diabetes mellitus ([place])   . Morbid obesity, unspecified obesity type ([place])   . Diabetic cataract, associated with type 2 diabetes mellitus ([place])   . Benign hematuria   . Stress due to illness of family member    She is to try the atorvastatin again and if continued difficulty with that, she will call.  I will switch her to a different statin.  Encouraged her to be as physically active as possible.  She is to check her blood sugars more regularly.  No medication adjustment given.  Flu shot given. Also discussed the possibility at some point down the road for her to go to [facility].  At this time she is not interested in doing that.     Immunization recommendations discussed.  Colonoscopy recommendations reviewed   Medicare Attestation I have personally reviewed: The  patient's medical and social history Their use of alcohol, tobacco or illicit drugs Their current medications and supplements The patient's functional ability including ADLs,fall risks, home safety risks, cognitive, and hearing and visual impairment Diet and physical activities Evidence for depression or mood disorders  The patient's weight, height, and BMI have been recorded in the chart.  I have made referrals, counseling, and provided education to the patient based on review of the above and I have provided the patient with a written personalized care plan for preventive services.     Jill Alexanders, MD   10/18/2018

## 2018-10-20 NOTE — Addendum Note (Signed)
Addended by: Elyse Jarvis on: 10/20/2018 10:07 AM   Modules accepted: Orders

## 2018-11-07 ENCOUNTER — Other Ambulatory Visit: Payer: Self-pay | Admitting: Family Medicine

## 2018-11-07 DIAGNOSIS — I152 Hypertension secondary to endocrine disorders: Secondary | ICD-10-CM

## 2018-11-07 DIAGNOSIS — E118 Type 2 diabetes mellitus with unspecified complications: Secondary | ICD-10-CM

## 2018-11-07 DIAGNOSIS — E1159 Type 2 diabetes mellitus with other circulatory complications: Secondary | ICD-10-CM

## 2018-11-10 ENCOUNTER — Telehealth: Payer: Self-pay | Admitting: Family Medicine

## 2018-11-10 ENCOUNTER — Other Ambulatory Visit: Payer: Self-pay | Admitting: Family Medicine

## 2018-11-10 DIAGNOSIS — E118 Type 2 diabetes mellitus with unspecified complications: Secondary | ICD-10-CM

## 2018-11-10 NOTE — Telephone Encounter (Signed)
   Pt is on Levemir 82 units and has been for quite some time [facility] Her notes from 07/19/15 visit mentions the 82 units  Can we get this updated/corrected in her chart and confirmed to [facility]

## 2018-11-11 ENCOUNTER — Other Ambulatory Visit: Payer: Self-pay

## 2018-11-11 ENCOUNTER — Telehealth: Payer: Self-pay | Admitting: Family Medicine

## 2018-11-11 DIAGNOSIS — Z794 Long term (current) use of insulin: Secondary | ICD-10-CM

## 2018-11-11 DIAGNOSIS — E118 Type 2 diabetes mellitus with unspecified complications: Secondary | ICD-10-CM

## 2018-11-11 MED ORDER — LEVEMIR FLEXTOUCH 100 UNIT/ML ~~LOC~~ SOPN: 82.0000 [IU] | PEN_INJECTOR | Freq: Every day | SUBCUTANEOUS | 1 refills | Status: DC

## 2018-11-11 NOTE — Telephone Encounter (Signed)
levemir is also supposed to be a 90 day supply

## 2018-11-11 NOTE — Telephone Encounter (Signed)
Called pharmacy and sent in new script and called pt to advise of script was filled. [place]

## 2018-11-11 NOTE — Telephone Encounter (Signed)
I sent note back yesterday about pt's levemir . She is taking 82 units and [facility] needs clarification on this and it looks like [facility] with 80 units instead of 82 units. Can we get this corrected with [facility], pt needs to pick up her meds

## 2019-02-01 ENCOUNTER — Other Ambulatory Visit: Payer: Self-pay | Admitting: Family Medicine

## 2019-02-01 DIAGNOSIS — Z794 Long term (current) use of insulin: Secondary | ICD-10-CM

## 2019-02-01 DIAGNOSIS — E118 Type 2 diabetes mellitus with unspecified complications: Secondary | ICD-10-CM

## 2019-03-15 ENCOUNTER — Ambulatory Visit: Payer: Medicare Other | Admitting: Family Medicine

## 2019-04-08 ENCOUNTER — Encounter: Payer: Self-pay | Admitting: Family Medicine

## 2019-04-08 ENCOUNTER — Ambulatory Visit (INDEPENDENT_AMBULATORY_CARE_PROVIDER_SITE_OTHER): Payer: Medicare PPO | Admitting: Family Medicine

## 2019-04-08 ENCOUNTER — Other Ambulatory Visit: Payer: Self-pay

## 2019-04-08 VITALS — BP 118/72 | HR 91 | Temp 96.4°F | Wt 379.0 lb

## 2019-04-08 DIAGNOSIS — E118 Type 2 diabetes mellitus with unspecified complications: Secondary | ICD-10-CM

## 2019-04-08 DIAGNOSIS — E1169 Type 2 diabetes mellitus with other specified complication: Secondary | ICD-10-CM | POA: Diagnosis not present

## 2019-04-08 DIAGNOSIS — E785 Hyperlipidemia, unspecified: Secondary | ICD-10-CM

## 2019-04-08 DIAGNOSIS — Z6379 Other stressful life events affecting family and household: Secondary | ICD-10-CM

## 2019-04-08 DIAGNOSIS — I10 Essential (primary) hypertension: Secondary | ICD-10-CM | POA: Diagnosis not present

## 2019-04-08 DIAGNOSIS — E1159 Type 2 diabetes mellitus with other circulatory complications: Secondary | ICD-10-CM | POA: Diagnosis not present

## 2019-04-08 DIAGNOSIS — Z794 Long term (current) use of insulin: Secondary | ICD-10-CM

## 2019-04-08 DIAGNOSIS — I152 Hypertension secondary to endocrine disorders: Secondary | ICD-10-CM

## 2019-04-08 LAB — POCT GLYCOSYLATED HEMOGLOBIN (HGB A1C): Hemoglobin A1C: 7.6 % — AB (ref 4.0–5.6)

## 2019-04-08 NOTE — Progress Notes (Signed)
  Subjective:    Patient ID: Marcia Paul, female    DOB: [birth year], 74 y.o.   MRN: [id number]  Marcia Paul is a 74 y.o. female who presents for follow-up of Type 2 diabetes mellitus.  Home blood sugar records: meter records 105-190  Current symptoms/problems include none at this time. Daily foot checks: yes   Any foot concerns: none  Exercise: walking and lifting Diet: regular Her husband has been having a great deal of difficulty with his underlying medical conditions as well as recent hospitalizations.  He is now home however he said he is taking care of him in terms of helping with wound care, nutrition and physical therapy.  This is keeping her quite busy and has her very stressed.  He is slowly recovering.  She continues on Janumet as well as insulin and continues on olmesartan/HCTZ.  She admits to letting her own health go on the back burner. The following portions of the patient's history were reviewed and updated as appropriate: allergies, current medications, past medical history, past social history and problem list.  ROS as in subjective above.     Objective:    Physical Exam Alert and in no distress otherwise not examined. Hemoglobin A1c is 7.6  Lab Review Diabetic Labs Latest Ref Rng & Units 10/18/2018 03/30/2018 11/03/2017 06/29/2017 12/29/2016  HbA1c 4.0 - 5.6 % 7.5(A) 6.7(A) 7.0(A) 7.7 6.9%  Microalbumin mg/L - - - - -  Micro/Creat Ratio - - - - - -  Chol 100 - 199 mg/dL - 712 - - 458  HDL >09 mg/dL - 62 - - 59  Calc LDL 0 - 99 mg/dL - 56 - - 63  Triglycerides 0 - 149 mg/dL - 79 - - 983  Creatinine 0.57 - 1.00 mg/dL - 3.82 - - 5.05   BP/Weight 04/08/2019 10/18/2018 03/30/2018 11/03/2017 06/29/2017  Systolic BP 118 130 134 130 132  Diastolic BP 72 82 82 88 84  Wt. (Lbs) 379 400.2 395.2 396.8 412.8  BMI 59.36 62.68 61.9 62.15 64.65   Foot/eye exam completion dates Latest Ref Rng & Units 04/05/2018 03/30/2018  Eye Exam No Retinopathy No Retinopathy -  Foot Form  Completion - - Done    Marcia Paul  reports that she quit smoking about 5 years ago. Her smoking use included cigarettes. She smoked 1.00 pack per day. She has never used smokeless tobacco. She reports current alcohol use. She reports that she does not use drugs.     Assessment & Plan:    Stress due to illness of family member  Type 2 diabetes mellitus with complication, with long-term current use of insulin (HCC)  Morbid obesity, unspecified obesity type (HCC)  Hypertension associated with diabetes (HCC)  Hyperlipidemia associated with type 2 diabetes mellitus (HCC)   1. Rx changes: none 2. Education: Reviewed 'ABCs' of diabetes management (respective goals in parentheses):  A1C (<7), blood pressure (<130/80), and cholesterol (LDL <100). 3. Compliance at present is estimated to be fair. Efforts to improve compliance (if necessary) will be directed at . 4. Follow up: 4 months  The majority of the encounter of over 25 minutes was spent discussing care for her husband but also to not forget about taking good care of herself.  Strongly encouraged her to reach out to her daughter and son-in-law for help and not expect him to be able to guess what is needed or not needed.

## 2019-04-08 NOTE — Addendum Note (Signed)
Addended by: Renelda Loma on: 04/08/2019 04:14 PM   Modules accepted: Orders

## 2019-04-11 ENCOUNTER — Telehealth: Payer: Self-pay | Admitting: Family Medicine

## 2019-04-11 NOTE — Telephone Encounter (Signed)
Just spoke to Marcia Paul and she would like you to call her please concerning Marcia Paul. Marcia Paul has clot in his leg and has also tested positive for Covid. He is being moved to [facility]. She is upset and scared and also needs to know when she should be tested

## 2019-04-12 ENCOUNTER — Ambulatory Visit: Payer: Medicare PPO | Admitting: Family Medicine

## 2019-04-14 ENCOUNTER — Ambulatory Visit: Payer: Medicare PPO | Attending: Internal Medicine

## 2019-04-14 ENCOUNTER — Telehealth: Payer: Self-pay | Admitting: Family Medicine

## 2019-04-14 DIAGNOSIS — Z20822 Contact with and (suspected) exposure to covid-19: Secondary | ICD-10-CM | POA: Diagnosis not present

## 2019-04-16 LAB — NOVEL CORONAVIRUS, NAA: SARS-CoV-2, NAA: NOT DETECTED

## 2019-04-18 ENCOUNTER — Other Ambulatory Visit: Payer: Medicare Other

## 2019-04-19 NOTE — Telephone Encounter (Signed)
error 

## 2019-04-20 ENCOUNTER — Telehealth: Payer: Self-pay | Admitting: Family Medicine

## 2019-04-20 ENCOUNTER — Other Ambulatory Visit: Payer: Self-pay

## 2019-04-20 DIAGNOSIS — Z794 Long term (current) use of insulin: Secondary | ICD-10-CM

## 2019-04-20 DIAGNOSIS — E118 Type 2 diabetes mellitus with unspecified complications: Secondary | ICD-10-CM

## 2019-04-20 DIAGNOSIS — E1159 Type 2 diabetes mellitus with other circulatory complications: Secondary | ICD-10-CM

## 2019-04-20 DIAGNOSIS — I152 Hypertension secondary to endocrine disorders: Secondary | ICD-10-CM

## 2019-04-20 MED ORDER — BELSOMRA 10 MG PO TABS: 1.0000 | ORAL_TABLET | Freq: Every evening | ORAL | 0 refills | Status: DC | PRN

## 2019-04-20 NOTE — Telephone Encounter (Signed)
Pt was advised Marcia Paul 

## 2019-04-20 NOTE — Telephone Encounter (Signed)
Added new telephone note to pt chart. No need for meds at this time. KH

## 2019-04-20 NOTE — Telephone Encounter (Signed)
done

## 2019-04-20 NOTE — Telephone Encounter (Signed)
I received a message from the pt. Stating she need an authorization on levemir, janumet, and Public relations account executive, I called and check with her insurance company no authorizations were needed for any of these medications. She needs 90 day supply of these three medications called  In to her pharmacy pt. Last apt. Was 04/12/19.

## 2019-04-20 NOTE — Telephone Encounter (Signed)
Pt called she needs to have approval from her insurance company to have her janumet, levemir and olmesartan-hct approved for 90 day supplies   Please notify patient when approved

## 2019-04-20 NOTE — Telephone Encounter (Signed)
Pt was called and she does not need the script but according to he booklet any 90 day supply has to be approved by doctor and insurance. Pt is not out of meds at this time but will advise office before she get to low and we can send in 90 day script and a PA will possibly come back due to amount. KH

## 2019-04-20 NOTE — Telephone Encounter (Signed)
Marcia Paul called and wanted to see if she could get something to help her sleep. Said she hasnt slept in over a week. [facility] [address]

## 2019-04-29 ENCOUNTER — Other Ambulatory Visit: Payer: Self-pay | Admitting: Family Medicine

## 2019-04-29 DIAGNOSIS — E1159 Type 2 diabetes mellitus with other circulatory complications: Secondary | ICD-10-CM

## 2019-04-30 ENCOUNTER — Other Ambulatory Visit: Payer: Self-pay | Admitting: Family Medicine

## 2019-04-30 DIAGNOSIS — E118 Type 2 diabetes mellitus with unspecified complications: Secondary | ICD-10-CM

## 2019-04-30 DIAGNOSIS — Z794 Long term (current) use of insulin: Secondary | ICD-10-CM

## 2019-05-09 ENCOUNTER — Other Ambulatory Visit: Payer: Self-pay | Admitting: Family Medicine

## 2019-05-26 ENCOUNTER — Other Ambulatory Visit: Payer: Self-pay

## 2019-05-26 MED ORDER — BLOOD GLUCOSE TEST VI STRP: 1.0000 | ORAL_STRIP | Freq: Three times a day (TID) | 2 refills | Status: DC

## 2019-05-26 MED ORDER — ACCU-CHEK SOFTCLIX LANCETS MISC: 1.0000 | Freq: Three times a day (TID) | 2 refills | Status: DC

## 2019-05-29 ENCOUNTER — Other Ambulatory Visit: Payer: Self-pay | Admitting: Family Medicine

## 2019-05-30 ENCOUNTER — Telehealth: Payer: Self-pay | Admitting: Family Medicine

## 2019-05-30 NOTE — Telephone Encounter (Signed)
  Pt question why this Belsomra rx was for #20 Last rx was #30   Cheaper for #30

## 2019-05-30 NOTE — Telephone Encounter (Signed)
[facility] Please advise KH 

## 2019-05-30 NOTE — Telephone Encounter (Signed)
Pt was advised and only uses it for back up. KH

## 2019-05-30 NOTE — Telephone Encounter (Signed)
Let her know that I do not mind giving it but do not want her to use it regularly but only as needed

## 2019-06-07 ENCOUNTER — Other Ambulatory Visit: Payer: Self-pay | Admitting: Family Medicine

## 2019-06-07 DIAGNOSIS — Z794 Long term (current) use of insulin: Secondary | ICD-10-CM

## 2019-06-07 DIAGNOSIS — E118 Type 2 diabetes mellitus with unspecified complications: Secondary | ICD-10-CM

## 2019-06-08 ENCOUNTER — Other Ambulatory Visit: Payer: Self-pay

## 2019-06-08 DIAGNOSIS — E118 Type 2 diabetes mellitus with unspecified complications: Secondary | ICD-10-CM

## 2019-06-08 DIAGNOSIS — Z794 Long term (current) use of insulin: Secondary | ICD-10-CM

## 2019-06-08 MED ORDER — LEVEMIR FLEXTOUCH 100 UNIT/ML ~~LOC~~ SOPN: 82.0000 [IU] | PEN_INJECTOR | Freq: Every day | SUBCUTANEOUS | 1 refills | Status: DC

## 2019-08-08 ENCOUNTER — Other Ambulatory Visit: Payer: Self-pay

## 2019-08-08 ENCOUNTER — Ambulatory Visit: Payer: Medicare PPO | Admitting: Family Medicine

## 2019-08-08 ENCOUNTER — Other Ambulatory Visit: Payer: Self-pay | Admitting: Family Medicine

## 2019-08-08 ENCOUNTER — Encounter: Payer: Self-pay | Admitting: Family Medicine

## 2019-08-08 VITALS — BP 146/90 | HR 84 | Temp 98.4°F | Wt 389.0 lb

## 2019-08-08 DIAGNOSIS — I10 Essential (primary) hypertension: Secondary | ICD-10-CM

## 2019-08-08 DIAGNOSIS — J309 Allergic rhinitis, unspecified: Secondary | ICD-10-CM | POA: Diagnosis not present

## 2019-08-08 DIAGNOSIS — Z794 Long term (current) use of insulin: Secondary | ICD-10-CM

## 2019-08-08 DIAGNOSIS — Z789 Other specified health status: Secondary | ICD-10-CM | POA: Diagnosis not present

## 2019-08-08 DIAGNOSIS — E1159 Type 2 diabetes mellitus with other circulatory complications: Secondary | ICD-10-CM | POA: Diagnosis not present

## 2019-08-08 DIAGNOSIS — E118 Type 2 diabetes mellitus with unspecified complications: Secondary | ICD-10-CM | POA: Diagnosis not present

## 2019-08-08 DIAGNOSIS — E785 Hyperlipidemia, unspecified: Secondary | ICD-10-CM | POA: Diagnosis not present

## 2019-08-08 DIAGNOSIS — E1169 Type 2 diabetes mellitus with other specified complication: Secondary | ICD-10-CM

## 2019-08-08 DIAGNOSIS — G479 Sleep disorder, unspecified: Secondary | ICD-10-CM | POA: Diagnosis not present

## 2019-08-08 LAB — POCT GLYCOSYLATED HEMOGLOBIN (HGB A1C): Hemoglobin A1C: 7.5 % — AB (ref 4.0–5.6)

## 2019-08-08 MED ORDER — BELSOMRA 20 MG PO TABS: 1.0000 | ORAL_TABLET | Freq: Every evening | ORAL | 1 refills | Status: DC | PRN

## 2019-08-08 NOTE — Progress Notes (Signed)
Subjective:    Patient ID: Marcia Paul, female    DOB: 15-Mar-1945, 74 y.o.   MRN: [id number]  Marcia Paul is a 74 y.o. female who presents for follow-up of Type 2 diabetes mellitus.  Home blood sugar records: meter record, fasting and post meal , 130-150  Current symptoms/problems include none at this time. Daily foot checks: yes   Any foot concerns: none at this time  Exercise: staying active Diet: fair  She continues on long-acting insulin at 82 units a day.  She also is taking Janumet.  She does have a history of statin intolerance.  She continues on olmesartan/HCTZ for her blood pressure.  She is taking omega-3 fatty acids for her cholesterol and triglycerides.  Her allergies seem to be under good control with OTC medications. The following portions of the patient's history were reviewed and updated as appropriate: allergies, current medications, past medical history, past social history and problem list. She seems to be handling her own with all the marital stress that she is under.  Her husband was recently in the hospital with Covid pneumonia. ROS as in subjective above.     Objective:    Physical Exam Alert and in no distress otherwise not examined.  Hemoglobin A1c is 7.5 Lab Review Diabetic Labs Latest Ref Rng & Units 04/08/2019 10/18/2018 03/30/2018 11/03/2017 06/29/2017  HbA1c 4.0 - 5.6 % 7.6(A) 7.5(A) 6.7(A) 7.0(A) 7.7  Microalbumin mg/L - - - - -  Micro/Creat Ratio - - - - - -  Chol 100 - 199 mg/dL - - 134 - -  HDL >39 mg/dL - - 62 - -  Calc LDL 0 - 99 mg/dL - - 56 - -  Triglycerides 0 - 149 mg/dL - - 79 - -  Creatinine 0.57 - 1.00 mg/dL - - 0.93 - -   BP/Weight 04/08/2019 10/18/2018 03/30/2018 11/03/2017 09/12/4578  Systolic BP 998 338 250 539 767  Diastolic BP 72 82 82 88 84  Wt. (Lbs) 379 400.2 395.2 396.8 412.8  BMI 59.36 62.68 61.9 62.15 64.65   Foot/eye exam completion dates Latest Ref Rng & Units 04/05/2018 03/30/2018  Eye Exam No Retinopathy No Retinopathy -   Foot Form Completion - - Done    Marcia Paul  reports that she quit smoking about 5 years ago. Her smoking use included cigarettes. She smoked 1.00 pack per day. She has never used smokeless tobacco. She reports current alcohol use. She reports that she does not use drugs.     Assessment & Plan:    Type 2 diabetes mellitus with complication, with long-term current use of insulin (HCC) - Plan: CBC with Differential/Platelet, Comprehensive metabolic panel, Lipid panel, POCT glycosylated hemoglobin (Hb A1C), CANCELED: POCT UA - Microalbumin  Morbid obesity, unspecified obesity type ([place]) - Plan: CBC with Differential/Platelet, Comprehensive metabolic panel, Lipid panel  Hyperlipidemia associated with type 2 diabetes mellitus ([place]) - Plan: Lipid panel  Hypertension associated with diabetes ([place]) - Plan: CBC with Differential/Platelet, Comprehensive metabolic panel  Statin intolerance  Sleep disturbance - Plan: Suvorexant (BELSOMRA) 20 MG TABS  Allergic rhinitis, unspecified seasonality, unspecified trigger   1. Rx changes: none 2. Education: Reviewed 'ABCs' of diabetes management (respective goals in parentheses):  A1C (<7), blood pressure (<130/80), and cholesterol (LDL <100). 3. Compliance at present is estimated to be fair. Efforts to improve compliance (if necessary) will be directed at increased exercise.  I was recommend that she take time out of her day for herself but she wise finds a  reason to avoid that. 4. Follow up: 4 months

## 2019-08-09 LAB — LIPID PANEL
Chol/HDL Ratio: 3.1 ratio (ref 0.0–4.4)
Cholesterol, Total: 213 mg/dL — ABNORMAL HIGH (ref 100–199)
HDL: 68 mg/dL (ref >39)
LDL Chol Calc (NIH): 127 mg/dL — ABNORMAL HIGH (ref 0–99)
Triglycerides: 104 mg/dL (ref 0–149)
VLDL Cholesterol Cal: 18 mg/dL (ref 5–40)

## 2019-08-09 LAB — COMPREHENSIVE METABOLIC PANEL
ALT: 18 IU/L (ref 0–32)
AST: 34 IU/L (ref 0–40)
Albumin/Globulin Ratio: 1.6 (ref 1.2–2.2)
Albumin: 4.5 g/dL (ref 3.7–4.7)
Alkaline Phosphatase: 123 IU/L — ABNORMAL HIGH (ref 48–121)
BUN/Creatinine Ratio: 21 (ref 12–28)
BUN: 23 mg/dL (ref 8–27)
Bilirubin Total: 0.4 mg/dL (ref 0.0–1.2)
CO2: 25 mmol/L (ref 20–29)
Calcium: 10.7 mg/dL — ABNORMAL HIGH (ref 8.7–10.3)
Chloride: 103 mmol/L (ref 96–106)
Creatinine, Ser: 1.07 mg/dL — ABNORMAL HIGH (ref 0.57–1.00)
GFR calc Af Amer: 60 mL/min/{1.73_m2} (ref >59)
GFR calc non Af Amer: 52 mL/min/{1.73_m2} — ABNORMAL LOW (ref >59)
Globulin, Total: 2.8 g/dL (ref 1.5–4.5)
Glucose: 151 mg/dL — ABNORMAL HIGH (ref 65–99)
Potassium: 4.6 mmol/L (ref 3.5–5.2)
Sodium: 142 mmol/L (ref 134–144)
Total Protein: 7.3 g/dL (ref 6.0–8.5)

## 2019-08-09 LAB — CBC WITH DIFFERENTIAL/PLATELET
Basophils Absolute: 0.1 10*3/uL (ref 0.0–0.2)
Basos: 1 %
EOS (ABSOLUTE): 0.2 10*3/uL (ref 0.0–0.4)
Eos: 2 %
Hematocrit: 39.9 % (ref 34.0–46.6)
Hemoglobin: 12.6 g/dL (ref 11.1–15.9)
Immature Grans (Abs): 0 10*3/uL (ref 0.0–0.1)
Immature Granulocytes: 0 %
Lymphocytes Absolute: 1.7 10*3/uL (ref 0.7–3.1)
Lymphs: 18 %
MCH: 28.1 pg (ref 26.6–33.0)
MCHC: 31.6 g/dL (ref 31.5–35.7)
MCV: 89 fL (ref 79–97)
Monocytes Absolute: 0.6 10*3/uL (ref 0.1–0.9)
Monocytes: 7 %
Neutrophils Absolute: 6.8 10*3/uL (ref 1.4–7.0)
Neutrophils: 72 %
Platelets: 311 10*3/uL (ref 150–450)
RBC: 4.48 x10E6/uL (ref 3.77–5.28)
RDW: 12.9 % (ref 11.7–15.4)
WBC: 9.3 10*3/uL (ref 3.4–10.8)

## 2019-10-24 ENCOUNTER — Ambulatory Visit: Payer: Self-pay | Admitting: Family Medicine

## 2019-11-12 ENCOUNTER — Other Ambulatory Visit: Payer: Self-pay | Admitting: Family Medicine

## 2019-11-12 DIAGNOSIS — I152 Hypertension secondary to endocrine disorders: Secondary | ICD-10-CM

## 2019-11-12 DIAGNOSIS — Z794 Long term (current) use of insulin: Secondary | ICD-10-CM

## 2019-11-14 ENCOUNTER — Other Ambulatory Visit: Payer: Self-pay

## 2019-11-14 ENCOUNTER — Ambulatory Visit (INDEPENDENT_AMBULATORY_CARE_PROVIDER_SITE_OTHER): Payer: Medicare PPO | Admitting: Family Medicine

## 2019-11-14 ENCOUNTER — Encounter: Payer: Self-pay | Admitting: Family Medicine

## 2019-11-14 VITALS — BP 130/90 | HR 94 | Temp 97.8°F | Resp 18

## 2019-11-14 DIAGNOSIS — R1319 Other dysphagia: Secondary | ICD-10-CM

## 2019-11-14 DIAGNOSIS — R070 Pain in throat: Secondary | ICD-10-CM

## 2019-11-14 DIAGNOSIS — R1310 Dysphagia, unspecified: Secondary | ICD-10-CM | POA: Diagnosis not present

## 2019-11-14 MED ORDER — ALUM & MAG HYDROXIDE-SIMETH 200-200-20 MG/5ML PO SUSP
30.0000 mL | Freq: Once | ORAL | Status: AC
  Administered 2019-11-14: 30 mL via ORAL

## 2019-11-14 NOTE — Patient Instructions (Signed)
Continue taking small sips of fluids this evening and have a soft diet for the next day or 2. You can also take Mylanta again this evening and tomorrow.  Call with questions or concerns  If you feel that you are still having issues, you can give Dr. Dulce Sellar at [facility] a call but hopefully this will resolve completely fairly soon

## 2019-11-14 NOTE — Progress Notes (Signed)
° °  Subjective:    Patient ID: Marcia Paul, female    DOB: 1945-07-23, 74 y.o.   MRN: [id number]  HPI Chief Complaint  Patient presents with   something stuck in throat    something stuck in throat.    She is here with complaints of feeling like something is stuck in her esophagus.  States she was eating  An egg sandwich which had pickles on it approximately 20 minutes prior to arrival.  States she had sharp pain that seems to be moving down her esophagus.  States she is able to swallow water and her saliva.  States it does not feel like it went down her windpipe. Is not coughing that much. States she tried to vomit prior to arrival but could not get it up.   Denies history of severe GERD or EGD.  No fever, chills, dizziness, palpitations, shortness of breath, N/V/D.   Reviewed allergies, medications, past medical, surgical, family, and social history.    Review of Systems Pertinent positives and negatives in the history of present illness.     Objective:   Physical Exam BP 130/90    Pulse 94    Temp 97.8 F (36.6 C)    Resp 18    SpO2 97%   Alert and in no distress.  Cardiac exam shows a regular sinus rhythm without murmurs or gallops. Lungs are clear to auscultation.      Assessment & Plan:  Esophageal dysphagia  Throat discomfort - Plan: alum & mag hydroxide-simeth (MAALOX/MYLANTA) 200-200-20 MG/5ML suspension 30 mL  Denies history of similar issue.  She was given Mylanta in the office and this seemed to help her symptoms. The pain moved downward suggesting no obstruction. Airway normal. No sign of respiratory involvement. She was able to keep down sips of water without any difficulty.  Discussed taking Mylanta for the next couple of days and samples provided.  Recommend liquids this evening and a soft diet for a day or two. May follow up with Dr. Dulce Sellar, her GI.

## 2019-11-17 ENCOUNTER — Telehealth: Payer: Self-pay

## 2019-11-17 ENCOUNTER — Telehealth: Payer: Self-pay | Admitting: Internal Medicine

## 2019-11-17 ENCOUNTER — Other Ambulatory Visit: Payer: Self-pay

## 2019-11-17 ENCOUNTER — Other Ambulatory Visit: Payer: Self-pay | Admitting: Family Medicine

## 2019-11-17 DIAGNOSIS — E118 Type 2 diabetes mellitus with unspecified complications: Secondary | ICD-10-CM

## 2019-11-17 DIAGNOSIS — Z794 Long term (current) use of insulin: Secondary | ICD-10-CM

## 2019-11-17 MED ORDER — LEVEMIR FLEXTOUCH 100 UNIT/ML ~~LOC~~ SOPN: 82.0000 [IU] | PEN_INJECTOR | Freq: Every day | SUBCUTANEOUS | 1 refills | Status: DC

## 2019-11-17 NOTE — Telephone Encounter (Signed)
Pharmacy stated that they have sent over faxes to request a 90 supply of pt levemir. I have not gotten any but I did go ahead and resend it in due to pt being current on her appointment. KH

## 2019-11-17 NOTE — Telephone Encounter (Signed)
Pt called and states that her levemir was sent in for 22ml to last a couple days. She needs 25 pens for it to equal 90 days. Her rx now has to have ML and not pens. So its 75 ML for 90 days

## 2019-12-09 ENCOUNTER — Telehealth: Payer: Self-pay | Admitting: Family Medicine

## 2019-12-09 DIAGNOSIS — G479 Sleep disorder, unspecified: Secondary | ICD-10-CM

## 2019-12-09 NOTE — Telephone Encounter (Signed)
Pt requesting 90 day supply of Belsomra 20 mg  90 day supply will save her $50.00  [facility]

## 2019-12-11 MED ORDER — BELSOMRA 20 MG PO TABS: 1.0000 | ORAL_TABLET | Freq: Every evening | ORAL | 1 refills | Status: DC | PRN

## 2019-12-30 ENCOUNTER — Other Ambulatory Visit: Payer: Medicare PPO

## 2019-12-30 ENCOUNTER — Ambulatory Visit (INDEPENDENT_AMBULATORY_CARE_PROVIDER_SITE_OTHER): Payer: Medicare PPO

## 2019-12-30 ENCOUNTER — Other Ambulatory Visit: Payer: Self-pay

## 2019-12-30 DIAGNOSIS — Z23 Encounter for immunization: Secondary | ICD-10-CM

## 2020-02-09 ENCOUNTER — Encounter: Payer: Self-pay | Admitting: Family Medicine

## 2020-02-09 ENCOUNTER — Ambulatory Visit: Payer: Medicare PPO | Admitting: Family Medicine

## 2020-02-09 ENCOUNTER — Other Ambulatory Visit: Payer: Self-pay

## 2020-02-09 VITALS — BP 140/86 | HR 78 | Temp 96.6°F | Ht 67.0 in | Wt 397.4 lb

## 2020-02-09 DIAGNOSIS — E1136 Type 2 diabetes mellitus with diabetic cataract: Secondary | ICD-10-CM

## 2020-02-09 DIAGNOSIS — E118 Type 2 diabetes mellitus with unspecified complications: Secondary | ICD-10-CM | POA: Diagnosis not present

## 2020-02-09 DIAGNOSIS — J309 Allergic rhinitis, unspecified: Secondary | ICD-10-CM | POA: Diagnosis not present

## 2020-02-09 DIAGNOSIS — F439 Reaction to severe stress, unspecified: Secondary | ICD-10-CM | POA: Diagnosis not present

## 2020-02-09 DIAGNOSIS — E1169 Type 2 diabetes mellitus with other specified complication: Secondary | ICD-10-CM

## 2020-02-09 DIAGNOSIS — R1319 Other dysphagia: Secondary | ICD-10-CM | POA: Diagnosis not present

## 2020-02-09 DIAGNOSIS — Z794 Long term (current) use of insulin: Secondary | ICD-10-CM

## 2020-02-09 DIAGNOSIS — Z789 Other specified health status: Secondary | ICD-10-CM | POA: Diagnosis not present

## 2020-02-09 DIAGNOSIS — I152 Hypertension secondary to endocrine disorders: Secondary | ICD-10-CM

## 2020-02-09 DIAGNOSIS — Z0000 Encounter for general adult medical examination without abnormal findings: Secondary | ICD-10-CM | POA: Diagnosis not present

## 2020-02-09 DIAGNOSIS — E1159 Type 2 diabetes mellitus with other circulatory complications: Secondary | ICD-10-CM

## 2020-02-09 DIAGNOSIS — E785 Hyperlipidemia, unspecified: Secondary | ICD-10-CM

## 2020-02-09 LAB — POCT GLYCOSYLATED HEMOGLOBIN (HGB A1C): Hemoglobin A1C: 7.7 % — AB (ref 4.0–5.6)

## 2020-02-09 MED ORDER — LIVALO 2 MG PO TABS: 1.0000 | ORAL_TABLET | Freq: Every day | ORAL | 0 refills | Status: DC

## 2020-02-09 NOTE — Progress Notes (Signed)
Marcia Paul is a 74 y.o. female who presents for annual wellness visit and follow-up on chronic medical conditions.  She has underlying diabetes and is using Janumet as well as 82 units of insulin daily.  She is statin intolerant.  She is using Belsomra on an as-needed basis for her sleep disturbance.  She is under a lot of stress dealing with her husband who is slowly deteriorating.  She continues on her blood pressure medication and is having no difficulty with that.  She recognizes the need for another eye examination.  She also plans to get another mammogram in the near future.  Her allergies seem to be under good control.  She states that she has had a DEXA scan in the past which was normal.  Immunizations and Health Maintenance Immunization History  Administered Date(s) Administered  . Fluad Quad(high Dose 65+) 10/18/2018, 12/30/2019  . Influenza Split 01/22/2011  . Influenza Whole 12/30/2005, 12/11/2006  . Influenza, High Dose Seasonal PF 01/11/2014, 11/22/2014, 11/09/2015, 10/31/2016, 01/04/2018  . Influenza, Seasonal, Injecte, Preservative Fre 03/22/2012  . Influenza,inj,Quad PF,6+ Mos 11/23/2012  . Moderna SARS-COVID-2 Vaccination 05/10/2019, 06/07/2019, 12/30/2019  . Pneumococcal Conjugate-13 04/15/2016  . Pneumococcal Polysaccharide-23 05/21/2005  . Tdap 04/30/2016  . Zoster 01/22/2011  . Zoster Recombinat (Shingrix) 04/30/2016, 10/30/2016   Health Maintenance Due  Topic Date Due  . PNA vac Low Risk Adult (2 of 2 - PPSV23) 04/15/2017  . OPHTHALMOLOGY EXAM  04/06/2019  . MAMMOGRAM  10/24/2019  . HEMOGLOBIN A1C  02/07/2020    Last Pap smear: AGED OUT Last mammogram: 10/23/17 Last colonoscopy: 02/08/14 Last DEXA: 09/25/08 Dentist: N/A Ophtho: over due Exercise: [facility] AND HEALTH AIDE  Other doctors caring for patient include: Dr. [facility]  Advanced directives: Does Patient Have a Medical Advance Directive?: No Would patient like information on creating a medical  advance directive?: Yes (ED - Information included in AVS)  Depression screen:  See questionnaire below.  Depression screen [facility] 2/9 02/09/2020 10/18/2018 11/03/2017 04/15/2016 10/17/2014  Decreased Interest 0 0 0 0 0  Down, Depressed, Hopeless 0 0 0 0 0  PHQ - 2 Score 0 0 0 0 0    Fall Risk Screen: see questionnaire below. Fall Risk  02/09/2020 02/09/2020 10/18/2018 11/03/2017 04/15/2016  Falls in the past year? 0 0 0 No No    ADL screen:  See questionnaire below Functional Status Survey: Is the patient deaf or have difficulty hearing?: No Does the patient have difficulty seeing, even when wearing glasses/contacts?: No Does the patient have difficulty concentrating, remembering, or making decisions?: No Does the patient have difficulty walking or climbing stairs?: No Does the patient have difficulty dressing or bathing?: No Does the patient have difficulty doing errands alone such as visiting a doctor's office or shopping?: No   Review of Systems Constitutional: -, -unexpected weight change, -anorexia, -fatigue Allergy: -sneezing, -itching, -congestion Dermatology: denies changing moles, rash, lumps ENT: -runny nose, -ear pain, -sore throat,  Cardiology:  -chest pain, -palpitations, -orthopnea, Respiratory: -cough, -shortness of breath, -dyspnea on exertion, -wheezing,  Gastroenterology: -abdominal pain, -nausea, -vomiting, -diarrhea, -constipation, -dysphagia Hematology: -bleeding or bruising problems Musculoskeletal: -arthralgias, -myalgias, -joint swelling, -back pain, - Ophthalmology: -vision changes,  Urology: -dysuria, -difficulty urinating,  -urinary frequency, -urgency, incontinence Neurology: -, -numbness, , -memory loss, -falls, -dizziness    PHYSICAL EXAM:  Pulse 78   Temp (!) 96.6 F (35.9 C)   Ht 5\' 7"  (1.702 m)   Wt (!) 397 lb 6.4 oz (180.3 kg)  SpO2 94%   BMI 62.24 kg/m   General Appearance: Alert, cooperative, no distress, appears stated age Head:  Normocephalic, without obvious abnormality, atraumatic Eyes: PERRL, conjunctiva/corneas clear, EOM's intact,  Ears: Normal TM's and external ear canals Nose: Nares normal, mucosa normal, no drainage or sinus tenderness Throat: Lips, mucosa, and tongue normal; teeth and gums normal Neck: Supple, no lymphadenopathy;  thyroid:  no enlargement/tenderness/nodules; no carotid bruit or JVD Lungs: Clear to auscultation bilaterally without wheezes, rales or ronchi; respirations unlabored Heart: Regular rate and rhythm, S1 and S2 normal, no murmur, rubor gallop Abdomen: Soft, non-tender, nondistended, normoactive bowel sounds,  no masses, no hepatosplenomegaly Extremities: No clubbing, cyanosis or edema Pulses: 2+ and symmetric all extremities Skin:  Skin color, texture, turgor normal, no rashes or lesions Lymph nodes: Cervical, supraclavicular, and axillary nodes normal Neurologic:  CNII-XII intact, normal strength, sensation and gait; reflexes 2+ and symmetric throughout Psych: Normal mood, affect, hygiene and grooming. Hemoglobin A1c is 7.7. ASSESSMENT/PLAN: Routine general medical examination at a health care facility  Type 2 diabetes mellitus with complication, with long-term current use of insulin (HCC)  Esophageal dysphagia  Morbid obesity, unspecified obesity type (HCC)  Hyperlipidemia associated with type 2 diabetes mellitus (HCC) - Plan: Pitavastatin Calcium (LIVALO) 2 MG TABS  Hypertension associated with diabetes (HCC)  Statin intolerance  Allergic rhinitis, unspecified seasonality, unspecified trigger  Diabetic cataract, associated with type 2 diabetes mellitus (HCC)  Stress at home Sample of Livalo given she will let me know if she has difficulty with that. Discussed the fact that we have only 3 variables for her diabetes which is diet, exercise and medications.  Strongly encouraged her to take better care of herself in general. I discussed the stress that she is having at  home but also strongly encouraged her to take good care of herself indicating that she did not take care of her so she could help take care of her husband.  She will continue on her present medication regimen, set up to see the ophthalmologist as well as get a mammogram.  Continue on present medications.  Discussed  at least 30 minutes of aerobic activity at least 5 days/week and weight-bearing exercise 2x/week;healthy diet, including goals of calcium and vitamin D intake Immunization recommendations discussed.  Colonoscopy recommendations reviewed   Medicare Attestation I have personally reviewed: The patient's medical and social history Their use of alcohol, tobacco or illicit drugs Their current medications and supplements The patient's functional ability including ADLs,fall risks, home safety risks, cognitive, and hearing and visual impairment Diet and physical activities Evidence for depression or mood disorders  The patient's weight, height, and BMI have been recorded in the chart.  I have made referrals, counseling, and provided education to the patient based on review of the above and I have provided the patient with a written personalized care plan for preventive services.     Sharlot Gowda, MD   02/09/2020

## 2020-02-09 NOTE — Patient Instructions (Signed)
  Marcia Paul , Thank you for taking time to come for your Medicare Wellness Visit. I appreciate your ongoing commitment to your health goals. Please review the following plan we discussed and let me know if I can assist you in the future.   These are the goals we discussed: Stay on your present medications.  Do not forget to take care of yourself as well as your husband. This is a list of the screening recommended for you and due dates:  Health Maintenance  Topic Date Due  . Pneumonia vaccines (2 of 2 - PPSV23) 04/15/2017  . Eye exam for diabetics  04/06/2019  . Mammogram  10/24/2019  . Hemoglobin A1C  02/07/2020  . Complete foot exam   08/07/2020  . Colon Cancer Screening  02/09/2024  . Tetanus Vaccine  04/30/2026  . Flu Shot  Completed  . DEXA scan (bone density measurement)  Completed  . COVID-19 Vaccine  Completed  .  Hepatitis C: One time screening is recommended by Center for Disease Control  (CDC) for  adults born from [birth year] through 1963.   Completed

## 2020-02-16 ENCOUNTER — Telehealth: Payer: Self-pay | Admitting: Family Medicine

## 2020-02-16 NOTE — Telephone Encounter (Signed)
Marcia Paul sent message she has stopped talking Livalvo. She said muscle and joint pain very bad, can hardly walk and has to use cane.  She said she does not want to try any more

## 2020-03-06 ENCOUNTER — Other Ambulatory Visit: Payer: Self-pay | Admitting: Family Medicine

## 2020-03-06 DIAGNOSIS — Z1231 Encounter for screening mammogram for malignant neoplasm of breast: Secondary | ICD-10-CM

## 2020-04-13 ENCOUNTER — Ambulatory Visit
Admission: RE | Admit: 2020-04-13 | Discharge: 2020-04-13 | Disposition: A | Discharge status: Home / Self Care | Payer: Medicare PPO | Source: Ambulatory Visit | Attending: Family Medicine | Admitting: Family Medicine

## 2020-04-13 ENCOUNTER — Other Ambulatory Visit: Payer: Self-pay

## 2020-04-13 DIAGNOSIS — Z1231 Encounter for screening mammogram for malignant neoplasm of breast: Secondary | ICD-10-CM | POA: Diagnosis not present

## 2020-04-25 ENCOUNTER — Telehealth: Payer: Self-pay

## 2020-04-25 NOTE — Telephone Encounter (Signed)
Pt is having  A hard time sleeping. She is on Belsomra  20 mg . One tab is not working. She would like to know if she would be able to take two or can she get something for sleep. Marland Kitchen Per Dr. Susann Givens she can take benadryl  with it to take.  KH

## 2020-05-01 ENCOUNTER — Telehealth: Payer: Self-pay | Admitting: Family Medicine

## 2020-05-01 NOTE — Telephone Encounter (Signed)
Patient would like you to call her  They just delivered Ed's death certificate and she has questions. The primary dx listed is acute metabolic encephalopathy

## 2020-05-06 ENCOUNTER — Other Ambulatory Visit: Payer: Self-pay | Admitting: Family Medicine

## 2020-05-06 DIAGNOSIS — E118 Type 2 diabetes mellitus with unspecified complications: Secondary | ICD-10-CM

## 2020-05-06 DIAGNOSIS — E1159 Type 2 diabetes mellitus with other circulatory complications: Secondary | ICD-10-CM

## 2020-05-06 DIAGNOSIS — I152 Hypertension secondary to endocrine disorders: Secondary | ICD-10-CM

## 2020-05-06 DIAGNOSIS — Z794 Long term (current) use of insulin: Secondary | ICD-10-CM

## 2020-05-07 ENCOUNTER — Telehealth: Payer: Self-pay | Admitting: Family Medicine

## 2020-05-07 NOTE — Telephone Encounter (Signed)
Spoke to pt and med was sent in. [facility]

## 2020-05-07 NOTE — Telephone Encounter (Signed)
Marcia Paul wants you to call her. She requested 2 meds refilled and a meter. She said meds were refilled but not the meter. She has not had a meter since Friday evening

## 2020-05-22 ENCOUNTER — Telehealth: Payer: Self-pay

## 2020-05-22 NOTE — Telephone Encounter (Signed)
Called pt back to find out what she needed. Pt advised that she was just done. Not sure who she spoke to last week while I was out but pt stated she was hung up on. She stated she was not upset with me but she was just done. Not sure what needs to be done. Just FYI. KH

## 2020-05-23 NOTE — Telephone Encounter (Signed)
Reviewed notes, I only see documentation from Isidoro Donning.  Will investigate.

## 2020-05-23 NOTE — Telephone Encounter (Signed)
Ok and thanks [employer]

## 2020-06-07 DIAGNOSIS — H25813 Combined forms of age-related cataract, bilateral: Secondary | ICD-10-CM | POA: Diagnosis not present

## 2020-06-07 DIAGNOSIS — H524 Presbyopia: Secondary | ICD-10-CM | POA: Diagnosis not present

## 2020-06-07 DIAGNOSIS — Z794 Long term (current) use of insulin: Secondary | ICD-10-CM | POA: Diagnosis not present

## 2020-06-07 DIAGNOSIS — H5203 Hypermetropia, bilateral: Secondary | ICD-10-CM | POA: Diagnosis not present

## 2020-06-07 DIAGNOSIS — E119 Type 2 diabetes mellitus without complications: Secondary | ICD-10-CM | POA: Diagnosis not present

## 2020-06-07 DIAGNOSIS — H52203 Unspecified astigmatism, bilateral: Secondary | ICD-10-CM | POA: Diagnosis not present

## 2020-06-07 LAB — HM DIABETES EYE EXAM

## 2020-06-14 DIAGNOSIS — H269 Unspecified cataract: Secondary | ICD-10-CM | POA: Insufficient documentation

## 2020-06-14 DIAGNOSIS — E1136 Type 2 diabetes mellitus with diabetic cataract: Secondary | ICD-10-CM | POA: Insufficient documentation

## 2020-06-21 ENCOUNTER — Other Ambulatory Visit: Payer: Self-pay

## 2020-06-21 ENCOUNTER — Ambulatory Visit: Payer: Medicare PPO | Admitting: Family Medicine

## 2020-06-21 ENCOUNTER — Encounter: Payer: Self-pay | Admitting: Family Medicine

## 2020-06-21 VITALS — BP 138/90 | HR 78 | Temp 97.3°F | Wt 388.4 lb

## 2020-06-21 DIAGNOSIS — E785 Hyperlipidemia, unspecified: Secondary | ICD-10-CM

## 2020-06-21 DIAGNOSIS — E1159 Type 2 diabetes mellitus with other circulatory complications: Secondary | ICD-10-CM | POA: Diagnosis not present

## 2020-06-21 DIAGNOSIS — Z7189 Other specified counseling: Secondary | ICD-10-CM

## 2020-06-21 DIAGNOSIS — E1136 Type 2 diabetes mellitus with diabetic cataract: Secondary | ICD-10-CM | POA: Diagnosis not present

## 2020-06-21 DIAGNOSIS — E1169 Type 2 diabetes mellitus with other specified complication: Secondary | ICD-10-CM

## 2020-06-21 DIAGNOSIS — J309 Allergic rhinitis, unspecified: Secondary | ICD-10-CM

## 2020-06-21 DIAGNOSIS — E118 Type 2 diabetes mellitus with unspecified complications: Secondary | ICD-10-CM

## 2020-06-21 DIAGNOSIS — I152 Hypertension secondary to endocrine disorders: Secondary | ICD-10-CM

## 2020-06-21 DIAGNOSIS — Z794 Long term (current) use of insulin: Secondary | ICD-10-CM

## 2020-06-21 DIAGNOSIS — Z789 Other specified health status: Secondary | ICD-10-CM | POA: Diagnosis not present

## 2020-06-21 LAB — POCT GLYCOSYLATED HEMOGLOBIN (HGB A1C): Hemoglobin A1C: 7.6 % — AB (ref 4.0–5.6)

## 2020-06-21 MED ORDER — EZETIMIBE 10 MG PO TABS: 10.0000 mg | ORAL_TABLET | Freq: Every day | ORAL | 3 refills | Status: DC

## 2020-06-21 NOTE — Progress Notes (Signed)
Subjective:    Patient ID: Marcia Paul, female    DOB: Dec 04, 1945, 75 y.o.   MRN: [id number]  Marcia Paul is a 75 y.o. female who presents for follow-up of Type 2 diabetes mellitus.  Home blood sugar records: meter records, fasting post meal ,120/155 Current symptoms/problems include none at this time. Daily foot checks: yes   Any foot concerns: in grown right great toe Exercise: staying active  Diet: good She continues on Janumet as well as her insulin at 80 units.  She does periodically check her blood sugars.  She continues on olmesartan/HCTZ without difficulty.  She is still having difficulty with his sleep and the sleep med does intermittently help.  OSA testing was discussed with her but she is not interested.  She is in the process of cleaning the house up from the death of her husband of 2 months ago.  She still trying to process all of this.  Her allergies seem to be under good control.  She has tried various medicines in the past none of which have been successful and she is comfortable with OTC homeopathic medicine for it.  She sees ophthalmology regularly for her cataract. The following portions of the patient's history were reviewed and updated as appropriate: allergies, current medications, past medical history, past social history and problem list.  ROS as in subjective above.     Objective:    Physical Exam Alert and in no distress otherwise not examined.  There were no vitals taken for this visit.  Lab Review Diabetic Labs Latest Ref Rng & Units 02/09/2020 08/08/2019 04/08/2019 10/18/2018 03/30/2018  HbA1c 4.0 - 5.6 % 7.7(A) 7.5(A) 7.6(A) 7.5(A) 6.7(A)  Microalbumin mg/L - - - - -  Micro/Creat Ratio - - - - - -  Chol 100 - 199 mg/dL - 081(K) - - 481  HDL >85 mg/dL - 68 - - 62  Calc LDL 0 - 99 mg/dL - 631(S) - - 56  Triglycerides 0 - 149 mg/dL - 970 - - 79  Creatinine 0.57 - 1.00 mg/dL - 2.63(Z) - - 8.58   BP/Weight 02/09/2020 11/14/2019 08/08/2019 04/08/2019 10/18/2018   Systolic BP 140 130 146 118 130  Diastolic BP 86 90 90 72 82  Wt. (Lbs) 397.4 - 389 379 400.2  BMI 62.24 - 60.93 59.36 62.68   Foot/eye exam completion dates Latest Ref Rng & Units 08/08/2019 04/05/2018  Eye Exam No Retinopathy - No Retinopathy  Foot Form Completion - Done -   Hemoglobin A1c is 7.6 Marcia Paul  reports that she quit smoking about 6 years ago. Her smoking use included cigarettes. She smoked 1.00 pack per day. She has never used smokeless tobacco. She reports current alcohol use. She reports that she does not use drugs.     Assessment & Plan:    Type 2 diabetes mellitus with complication, with long-term current use of insulin (HCC), Active - Plan: POCT glycosylated hemoglobin (Hb A1C)  Hypertension associated with diabetes (HCC)  Hyperlipidemia associated with type 2 diabetes mellitus (HCC) - Plan: ezetimibe (ZETIA) 10 MG tablet  Morbid obesity, unspecified obesity type (HCC)  Diabetic cataract, associated with type 2 diabetes mellitus (HCC)  Statin intolerance  Allergic rhinitis, unspecified seasonality, unspecified trigger  Bereavement counseling   1. Rx changes: I will add Zetia to her regimen. 2. Education: Reviewed 'ABCs' of diabetes management (respective goals in parentheses):  A1C (<7), blood pressure (<130/80), and cholesterol (LDL <100). 3. Compliance at present is estimated to be fair. Efforts  to improve compliance (if necessary) will be directed at increased exercise. 4. Follow up: 4 months Recommend she call hospice to get involved in bereavement counseling. Also strongly suggested that she take care of her self as well she took care of her husband.

## 2020-06-21 NOTE — Patient Instructions (Signed)
Call hospice at [phone] and get involved in bereavement counseling. Take care of yourself like you took care of Ed.

## 2020-07-19 ENCOUNTER — Other Ambulatory Visit: Payer: Self-pay | Admitting: Family Medicine

## 2020-07-19 DIAGNOSIS — G479 Sleep disorder, unspecified: Secondary | ICD-10-CM

## 2020-07-20 NOTE — Telephone Encounter (Signed)
[facility] Please advise KH 

## 2020-09-26 ENCOUNTER — Telehealth: Payer: Self-pay | Admitting: Family Medicine

## 2020-09-26 NOTE — Telephone Encounter (Signed)
Pt called and states that she is having a Gout flair up. Symptoms have been going on about two days. She would like medication sent in for her. [facility] [address] and can be reached at [phone].

## 2020-10-08 ENCOUNTER — Other Ambulatory Visit: Payer: Self-pay | Admitting: Family Medicine

## 2020-10-08 DIAGNOSIS — E118 Type 2 diabetes mellitus with unspecified complications: Secondary | ICD-10-CM

## 2020-10-08 DIAGNOSIS — Z794 Long term (current) use of insulin: Secondary | ICD-10-CM

## 2020-10-08 DIAGNOSIS — E1159 Type 2 diabetes mellitus with other circulatory complications: Secondary | ICD-10-CM

## 2020-10-10 ENCOUNTER — Other Ambulatory Visit: Payer: Self-pay

## 2020-10-10 ENCOUNTER — Telehealth: Payer: Self-pay | Admitting: Family Medicine

## 2020-10-10 DIAGNOSIS — E118 Type 2 diabetes mellitus with unspecified complications: Secondary | ICD-10-CM

## 2020-10-10 DIAGNOSIS — Z794 Long term (current) use of insulin: Secondary | ICD-10-CM

## 2020-10-10 MED ORDER — LEVEMIR FLEXTOUCH 100 UNIT/ML ~~LOC~~ SOPN: 82.0000 [IU] | PEN_INJECTOR | Freq: Every day | SUBCUTANEOUS | 1 refills | Status: DC

## 2020-10-10 NOTE — Telephone Encounter (Signed)
Pt's levemir rx was not sent for 90 day supply It was only sent for qty 15  Should be for qty 75   Please send in for 90 day supply

## 2020-10-10 NOTE — Telephone Encounter (Signed)
This was already done

## 2020-10-22 ENCOUNTER — Ambulatory Visit: Payer: Medicare PPO | Admitting: Family Medicine

## 2020-10-30 ENCOUNTER — Telehealth: Payer: Self-pay | Admitting: Family Medicine

## 2020-10-30 ENCOUNTER — Other Ambulatory Visit: Payer: Self-pay

## 2020-10-30 MED ORDER — BD PEN NEEDLE NANO 2ND GEN 32G X 4 MM MISC: 3.0000 | Freq: Three times a day (TID) | 1 refills | Status: DC

## 2020-10-30 NOTE — Telephone Encounter (Signed)
Pt needs refill on  BD Nano 2 Gen Pen 32G X4 mm needle  90 day supply   (FYI She had been what Ed had left, which is why she has not asked for refill earlier)

## 2020-11-09 ENCOUNTER — Ambulatory Visit: Payer: Medicare PPO | Admitting: Family Medicine

## 2020-12-11 ENCOUNTER — Other Ambulatory Visit: Payer: Self-pay | Admitting: Family Medicine

## 2020-12-11 DIAGNOSIS — G479 Sleep disorder, unspecified: Secondary | ICD-10-CM

## 2020-12-11 NOTE — Telephone Encounter (Signed)
[facility] is [facility] to fill [facility] belsomra. Please advise KH 

## 2020-12-13 ENCOUNTER — Other Ambulatory Visit: Payer: Self-pay

## 2020-12-13 ENCOUNTER — Ambulatory Visit: Payer: Medicare PPO | Admitting: Family Medicine

## 2020-12-13 VITALS — BP 130/80 | HR 81 | Temp 97.7°F | Wt >= 6400 oz

## 2020-12-13 DIAGNOSIS — Z7189 Other specified counseling: Secondary | ICD-10-CM

## 2020-12-13 DIAGNOSIS — E1136 Type 2 diabetes mellitus with diabetic cataract: Secondary | ICD-10-CM | POA: Diagnosis not present

## 2020-12-13 DIAGNOSIS — E1159 Type 2 diabetes mellitus with other circulatory complications: Secondary | ICD-10-CM | POA: Diagnosis not present

## 2020-12-13 DIAGNOSIS — E1169 Type 2 diabetes mellitus with other specified complication: Secondary | ICD-10-CM

## 2020-12-13 DIAGNOSIS — I152 Hypertension secondary to endocrine disorders: Secondary | ICD-10-CM

## 2020-12-13 DIAGNOSIS — Z23 Encounter for immunization: Secondary | ICD-10-CM | POA: Diagnosis not present

## 2020-12-13 DIAGNOSIS — Z794 Long term (current) use of insulin: Secondary | ICD-10-CM

## 2020-12-13 DIAGNOSIS — J309 Allergic rhinitis, unspecified: Secondary | ICD-10-CM | POA: Diagnosis not present

## 2020-12-13 DIAGNOSIS — T466X5D Adverse effect of antihyperlipidemic and antiarteriosclerotic drugs, subsequent encounter: Secondary | ICD-10-CM

## 2020-12-13 DIAGNOSIS — T466X5A Adverse effect of antihyperlipidemic and antiarteriosclerotic drugs, initial encounter: Secondary | ICD-10-CM | POA: Insufficient documentation

## 2020-12-13 DIAGNOSIS — E785 Hyperlipidemia, unspecified: Secondary | ICD-10-CM

## 2020-12-13 DIAGNOSIS — E118 Type 2 diabetes mellitus with unspecified complications: Secondary | ICD-10-CM | POA: Diagnosis not present

## 2020-12-13 NOTE — Progress Notes (Signed)
l Subjective:    Patient ID: Marcia Paul, female    DOB: 06/16/1945, 75 y.o.   MRN: [id number]  Marcia Paul is a 75 y.o. female who presents for follow-up of Type 2 diabetes mellitus.   Home blood sugar records:  fasting and post meal 60- 148 Current symptoms/problems include none and have been improving. Daily foot checks: yes   Any foot concerns: second toe on right foot numbness  Exercise:  staying active  Diet: fair  When I asked her how she was doing and she started talking about the fact that she did not think she was progressing as well she should help her husband died.  She has not gone to bereavement counseling and is not involved in any other kinds of counseling this up with friends.  She has been reading about bereavement.  Further discussion with her indicates that she is essentially just existing at the present time and not really being engaged in the real world.  I reviewed all the medications that she is on and she is stable on all of these.  She has no particular concerns or questions about that.  She is still not very effectively active and indicates she is not eating like she should. The following portions of the patient's history were reviewed and updated as appropriate: allergies, current medications, past medical history, past social history and problem list.  ROS as in subjective above.     Objective:    Physical Exam Alert and in no distress otherwise not examined.  Hemoglobin A1c is 6.8  Lab Review Diabetic Labs Latest Ref Rng & Units 06/21/2020 02/09/2020 08/08/2019 04/08/2019 10/18/2018  HbA1c 4.0 - 5.6 % 7.6(A) 7.7(A) 7.5(A) 7.6(A) 7.5(A)  Microalbumin mg/L - - - - -  Micro/Creat Ratio - - - - - -  Chol 100 - 199 mg/dL - - 831(D) - -  HDL >17 mg/dL - - 68 - -  Calc LDL 0 - 99 mg/dL - - 616(W) - -  Triglycerides 0 - 149 mg/dL - - 737 - -  Creatinine 0.57 - 1.00 mg/dL - - 1.06(Y) - -   BP/Weight 12/13/2020 06/21/2020 02/09/2020 11/14/2019 08/08/2019   Systolic BP 130 138 140 130 146  Diastolic BP 80 90 86 90 90  Wt. (Lbs) 402.6 388.4 397.4 - 389  BMI 63.06 60.83 62.24 - 60.93   Foot/eye exam completion dates Latest Ref Rng & Units 12/13/2020 06/07/2020  Eye Exam No Retinopathy - No Retinopathy  Foot Form Completion - Done -    Marcia Paul  reports that she quit smoking about 6 years ago. Her smoking use included cigarettes. She smoked an average of 1 pack per day. She has never used smokeless tobacco. She reports current alcohol use. She reports that she does not use drugs.     Assessment & Plan:    Type 2 diabetes mellitus with complication, with long-term current use of insulin (HCC) - Plan: POCT glycosylated hemoglobin (Hb A1C)  Adverse effect of antihyperlipidemic drug, subsequent encounter  Hypertension associated with diabetes (HCC)  Hyperlipidemia associated with type 2 diabetes mellitus (HCC)  Diabetic cataract, associated with type 2 diabetes mellitus (HCC)  Morbid obesity, unspecified obesity type (HCC)  Allergic rhinitis, unspecified seasonality, unspecified trigger  Bereavement counseling  Need for influenza vaccination - Plan: Flu Vaccine QUAD High Dose(Fluad)  Immunization, viral disease - Plan: Moderna Covid-19 Vaccine Bivalent Booster  Rx changes: none Education: Reviewed 'ABCs' of diabetes management (respective goals in parentheses):  A1C (<  7), blood pressure (<130/80), and cholesterol (LDL <100). Compliance at present is estimated to be fair. Efforts to improve compliance (if necessary) will be directed at increased exercise. Follow up: 6 months Although medically she seems to be fairly stable, her psychological state is not what it should be.  Strongly suggested that she get involved in bereavement counseling and also told her daughter to do the same.  Hopefully this will give her the incentive to move on with her life.

## 2020-12-13 NOTE — Patient Instructions (Signed)
Call bereavement [phone]. When you are ready I will give you medication

## 2020-12-14 ENCOUNTER — Telehealth: Payer: Self-pay | Admitting: Family Medicine

## 2020-12-14 LAB — POCT GLYCOSYLATED HEMOGLOBIN (HGB A1C): Hemoglobin A1C: 6.8 % — AB (ref 4.0–5.6)

## 2020-12-14 NOTE — Telephone Encounter (Signed)
Pt's daughter came in and dropped off pt's POA. Sending back to [facility] for review.

## 2021-02-04 ENCOUNTER — Telehealth: Payer: Self-pay | Admitting: Family Medicine

## 2021-02-04 NOTE — Telephone Encounter (Signed)
Spoke with patient to schedule Medicare Annual Wellness Visit (AWV) either virtually or in office.   Patient declined  Last AWV ; 02/09/20 please schedule at anytime with health coach  This should be a 45 minute visit.

## 2021-03-12 ENCOUNTER — Telehealth: Payer: Self-pay | Admitting: Family Medicine

## 2021-03-12 NOTE — Telephone Encounter (Signed)
Pt wants to see if she still needs mammograms done yearly or if it is every other year.  Last mammogram was February 2022

## 2021-03-13 NOTE — Telephone Encounter (Signed)
Informed patient per Dr. Redmond School that every other year is fine for her mammogram

## 2021-04-11 ENCOUNTER — Telehealth: Payer: Self-pay | Admitting: Family Medicine

## 2021-04-11 ENCOUNTER — Other Ambulatory Visit: Payer: Self-pay | Admitting: Family Medicine

## 2021-04-11 DIAGNOSIS — G479 Sleep disorder, unspecified: Secondary | ICD-10-CM

## 2021-04-11 DIAGNOSIS — E118 Type 2 diabetes mellitus with unspecified complications: Secondary | ICD-10-CM

## 2021-04-11 DIAGNOSIS — I152 Hypertension secondary to endocrine disorders: Secondary | ICD-10-CM

## 2021-04-11 NOTE — Telephone Encounter (Signed)
Pt called and states she would like er Belsomra sent in for 90 day supply. It saves her a lot of money for it to be sent in that way. [facility] [address] and can be reached at [phone].

## 2021-04-11 NOTE — Telephone Encounter (Signed)
[facility] is [facility] to fill [facility] belsomra. Please advise KH 

## 2021-04-11 NOTE — Telephone Encounter (Signed)
Pt advised. KH 

## 2021-04-15 ENCOUNTER — Other Ambulatory Visit: Payer: Self-pay | Admitting: Family Medicine

## 2021-04-15 ENCOUNTER — Telehealth: Payer: Self-pay

## 2021-04-15 DIAGNOSIS — Z794 Long term (current) use of insulin: Secondary | ICD-10-CM

## 2021-04-15 DIAGNOSIS — E118 Type 2 diabetes mellitus with unspecified complications: Secondary | ICD-10-CM

## 2021-04-15 MED ORDER — LEVEMIR FLEXTOUCH 100 UNIT/ML ~~LOC~~ SOPN: 82.0000 [IU] | PEN_INJECTOR | Freq: Every day | SUBCUTANEOUS | 1 refills | Status: DC

## 2021-04-15 NOTE — Telephone Encounter (Signed)
Pharmacy called about pts. Levemir flextouch they no longer make that one anymore so they wanted to know if you could change it to Levemir flex pen 75 ml with 1 refill. [facility] [address]

## 2021-04-15 NOTE — Telephone Encounter (Signed)
Pt. Called back and stating that she is almost out of insulin so if could please send that in today.

## 2021-04-16 ENCOUNTER — Other Ambulatory Visit: Payer: Self-pay

## 2021-04-16 MED ORDER — LEVEMIR FLEXPEN 100 UNIT/ML ~~LOC~~ SOPN: 82.0000 [IU] | PEN_INJECTOR | Freq: Every day | SUBCUTANEOUS | 1 refills | Status: DC

## 2021-06-20 DIAGNOSIS — H52203 Unspecified astigmatism, bilateral: Secondary | ICD-10-CM | POA: Diagnosis not present

## 2021-06-20 DIAGNOSIS — H25813 Combined forms of age-related cataract, bilateral: Secondary | ICD-10-CM | POA: Diagnosis not present

## 2021-06-20 DIAGNOSIS — H5203 Hypermetropia, bilateral: Secondary | ICD-10-CM | POA: Diagnosis not present

## 2021-06-20 DIAGNOSIS — E1136 Type 2 diabetes mellitus with diabetic cataract: Secondary | ICD-10-CM | POA: Diagnosis not present

## 2021-06-20 DIAGNOSIS — Z7984 Long term (current) use of oral hypoglycemic drugs: Secondary | ICD-10-CM | POA: Diagnosis not present

## 2021-06-20 DIAGNOSIS — Z794 Long term (current) use of insulin: Secondary | ICD-10-CM | POA: Diagnosis not present

## 2021-06-20 DIAGNOSIS — H524 Presbyopia: Secondary | ICD-10-CM | POA: Diagnosis not present

## 2021-06-20 LAB — HM DIABETES EYE EXAM

## 2021-06-27 ENCOUNTER — Ambulatory Visit: Payer: Medicare PPO | Admitting: Family Medicine

## 2021-06-27 ENCOUNTER — Encounter: Payer: Self-pay | Admitting: Family Medicine

## 2021-06-27 VITALS — BP 136/84 | HR 71 | Temp 96.8°F | Wt >= 6400 oz

## 2021-06-27 DIAGNOSIS — E785 Hyperlipidemia, unspecified: Secondary | ICD-10-CM

## 2021-06-27 DIAGNOSIS — E1159 Type 2 diabetes mellitus with other circulatory complications: Secondary | ICD-10-CM

## 2021-06-27 DIAGNOSIS — J309 Allergic rhinitis, unspecified: Secondary | ICD-10-CM | POA: Diagnosis not present

## 2021-06-27 DIAGNOSIS — I152 Hypertension secondary to endocrine disorders: Secondary | ICD-10-CM | POA: Diagnosis not present

## 2021-06-27 DIAGNOSIS — E1136 Type 2 diabetes mellitus with diabetic cataract: Secondary | ICD-10-CM | POA: Diagnosis not present

## 2021-06-27 DIAGNOSIS — Z794 Long term (current) use of insulin: Secondary | ICD-10-CM | POA: Diagnosis not present

## 2021-06-27 DIAGNOSIS — E118 Type 2 diabetes mellitus with unspecified complications: Secondary | ICD-10-CM

## 2021-06-27 DIAGNOSIS — E1169 Type 2 diabetes mellitus with other specified complication: Secondary | ICD-10-CM

## 2021-06-27 LAB — POCT GLYCOSYLATED HEMOGLOBIN (HGB A1C): Hemoglobin A1C: 6.9 % — AB (ref 4.0–5.6)

## 2021-06-27 NOTE — Progress Notes (Signed)
?Subjective:  ? ? Patient ID: Marcia Paul, female    DOB: Jul 21, 1945, 76 y.o.   MRN: [id number] ? ?Marcia Paul is a 76 y.o. female who presents for follow-up of Type 2 diabetes mellitus. ? ?Home blood sugar records:  fasting and post meal lowest 110 highest 135 ?Current symptoms/problems include none and have been stable. ?Daily foot checks: yes   Any foot concerns: right foot  ?Exercise:  staying active when foot is not hurting ?Diet: good  ?She continues on Levemir 80 units plus Janumet.  She states her blood sugars in the morning run around 130.  She is also taking olmesartan/HCTZ.  She does have cataracts and is being followed for that.  Her allergies seem to be under good control.  Her weight is unremarkable.  She apparently has been getting some psychological counseling to help deal with the death of her husband and states that she is making progress with that.  She is not taking Zetia due to myalgias. ? ?The following portions of the patient's history were reviewed and updated as appropriate: allergies, current medications, past medical history, past social history and problem list. ? ?ROS as in subjective above. ? ?   ?Objective:  ?  ?Physical Exam ?Alert and in no distress otherwise not examined. ?Hemoglobin A1c is 6.8 ? ?Lab Review ? ?  Latest Ref Rng & Units 12/13/2020  ?  1:14 PM 06/21/2020  ?  4:05 PM 02/09/2020  ?  4:24 PM 08/08/2019  ?  3:29 PM 08/08/2019  ?  3:14 PM  ?Diabetic Labs  ?HbA1c 4.0 - 5.6 % 6.8   7.6   7.7   7.5     ?Chol 100 - 199 mg/dL     034    ?HDL >39 mg/dL     68    ?Calc LDL 0 - 99 mg/dL     742    ?Triglycerides 0 - 149 mg/dL     595    ?Creatinine 0.57 - 1.00 mg/dL     6.38    ? ? ?  75/64/3329  ?  3:47 PM 06/21/2020  ?  2:28 PM 02/09/2020  ?  2:02 PM 11/14/2019  ?  3:27 PM 08/08/2019  ?  2:35 PM  ?BP/Weight  ?Systolic BP 130 138 140 130 146  ?Diastolic BP 80 90 86 90 90  ?Wt. (Lbs) 402.6 388.4 397.4  389  ?BMI 63.06 kg/m2 60.83 kg/m2 62.24 kg/m2  60.93 kg/m2  ? ? ?  Latest Ref Rng  & Units 12/13/2020  ?  3:45 PM 06/07/2020  ? 12:00 AM  ?Foot/eye exam completion dates  ?Eye Exam No Retinopathy  No Retinopathy       ?Foot Form Completion  Done   ?  ? This result is from an external source.  ? ? ?Marcia Paul  reports that she quit smoking about 7 years ago. Her smoking use included cigarettes. She smoked an average of 1 pack per day. She has never used smokeless tobacco. She reports current alcohol use. She reports that she does not use drugs. ? ?   ?Assessment & Plan:  ?Type 2 diabetes mellitus with complication, with long-term current use of insulin (HCC) - Plan: POCT glycosylated hemoglobin (Hb A1C), CBC with Differential/Platelet, Comprehensive metabolic panel, Lipid panel, POCT UA - Microalbumin ? ?Hypertension associated with diabetes (HCC) - Plan: CBC with Differential/Platelet, Comprehensive metabolic panel ? ?Hyperlipidemia associated with type 2 diabetes mellitus (HCC) - Plan: Lipid panel ? ?Diabetic cataract,  associated with type 2 diabetes mellitus (HCC) ? ?Morbid obesity, unspecified obesity type (HCC) - Plan: CBC with Differential/Platelet, Comprehensive metabolic panel, Lipid panel ? ?Allergic rhinitis, unspecified seasonality, unspecified trigger ? ?  ?Rx changes: none ?Education: Reviewed ?ABCs? of diabetes management (respective goals in parentheses):  A1C (<7), blood pressure (<130/80), and cholesterol (LDL <100). ?Compliance at present is estimated to be fair. Efforts to improve compliance (if necessary) will be directed at increased exercise. ?Follow up: 6 months ?She will return here in 6 months and prior to that come back in 2 get set up to do continuous glucose monitoring to get a better feel for her diabetes status.  Encouraged her to become more physically active and continue with her online counseling. ? ? ? ?  ?

## 2021-06-28 LAB — CBC WITH DIFFERENTIAL/PLATELET
Basophils Absolute: 0 10*3/uL (ref 0.0–0.2)
Basos: 1 %
EOS (ABSOLUTE): 0.1 10*3/uL (ref 0.0–0.4)
Eos: 1 %
Hematocrit: 41.1 % (ref 34.0–46.6)
Hemoglobin: 12.9 g/dL (ref 11.1–15.9)
Immature Grans (Abs): 0 10*3/uL (ref 0.0–0.1)
Immature Granulocytes: 0 %
Lymphocytes Absolute: 1.7 10*3/uL (ref 0.7–3.1)
Lymphs: 20 %
MCH: 27.6 pg (ref 26.6–33.0)
MCHC: 31.4 g/dL — ABNORMAL LOW (ref 31.5–35.7)
MCV: 88 fL (ref 79–97)
Monocytes Absolute: 0.6 10*3/uL (ref 0.1–0.9)
Monocytes: 7 %
Neutrophils Absolute: 5.8 10*3/uL (ref 1.4–7.0)
Neutrophils: 71 %
Platelets: 307 10*3/uL (ref 150–450)
RBC: 4.67 x10E6/uL (ref 3.77–5.28)
RDW: 13 % (ref 11.7–15.4)
WBC: 8.2 10*3/uL (ref 3.4–10.8)

## 2021-06-28 LAB — COMPREHENSIVE METABOLIC PANEL
ALT: 21 IU/L (ref 0–32)
AST: 31 IU/L (ref 0–40)
Albumin/Globulin Ratio: 1.9 (ref 1.2–2.2)
Albumin: 4.4 g/dL (ref 3.7–4.7)
Alkaline Phosphatase: 112 IU/L (ref 44–121)
BUN/Creatinine Ratio: 26 (ref 12–28)
BUN: 23 mg/dL (ref 8–27)
Bilirubin Total: 0.4 mg/dL (ref 0.0–1.2)
CO2: 22 mmol/L (ref 20–29)
Calcium: 10.3 mg/dL (ref 8.7–10.3)
Chloride: 100 mmol/L (ref 96–106)
Creatinine, Ser: 0.88 mg/dL (ref 0.57–1.00)
Globulin, Total: 2.3 g/dL (ref 1.5–4.5)
Glucose: 131 mg/dL — ABNORMAL HIGH (ref 70–99)
Potassium: 4.4 mmol/L (ref 3.5–5.2)
Sodium: 138 mmol/L (ref 134–144)
Total Protein: 6.7 g/dL (ref 6.0–8.5)
eGFR: 69 mL/min/{1.73_m2} (ref >59)

## 2021-06-28 LAB — LIPID PANEL
Chol/HDL Ratio: 3 ratio (ref 0.0–4.4)
Cholesterol, Total: 189 mg/dL (ref 100–199)
HDL: 64 mg/dL (ref >39)
LDL Chol Calc (NIH): 107 mg/dL — ABNORMAL HIGH (ref 0–99)
Triglycerides: 102 mg/dL (ref 0–149)
VLDL Cholesterol Cal: 18 mg/dL (ref 5–40)

## 2021-07-02 ENCOUNTER — Encounter: Payer: Self-pay | Admitting: Family Medicine

## 2021-07-09 ENCOUNTER — Encounter: Payer: Self-pay | Admitting: Family Medicine

## 2021-10-11 ENCOUNTER — Other Ambulatory Visit: Payer: Self-pay | Admitting: Family Medicine

## 2021-10-11 DIAGNOSIS — E1159 Type 2 diabetes mellitus with other circulatory complications: Secondary | ICD-10-CM

## 2021-10-11 DIAGNOSIS — E118 Type 2 diabetes mellitus with unspecified complications: Secondary | ICD-10-CM

## 2021-10-11 NOTE — Telephone Encounter (Signed)
Left VM to make sure pt needs refill

## 2021-10-12 ENCOUNTER — Telehealth: Payer: Self-pay | Admitting: Family Medicine

## 2021-10-12 NOTE — Telephone Encounter (Signed)
Pt called  her levernir pen rx was not sent for 90 days supply  (cost her $80 regardless of day supply) She needs rx to be for 35ml  or 5 boxes  for 90 day supply   [facility]

## 2021-10-14 ENCOUNTER — Ambulatory Visit: Payer: Self-pay

## 2021-10-14 MED ORDER — LEVEMIR FLEXPEN 100 UNIT/ML ~~LOC~~ SOPN: 82.0000 [IU] | PEN_INJECTOR | Freq: Every day | SUBCUTANEOUS | 1 refills | Status: DC

## 2021-10-14 NOTE — Patient Instructions (Signed)
Visit Information  Thank you for taking time to visit with me today. Please don't hesitate to contact me if I can be of assistance to you.   Following are the goals we discussed today:   Goals Addressed             This Visit's Progress    COMPLETED: Care Coordination Activties       Care Coordination Interventions: SDoH screening completed - no acute resource needs identified Performed chart review to note patient has annual wellness visit scheduled 12/27/21 Introduced care coordination program to the patient - no follow up needed at this time Encouraged the patient to speak with her primary care provider as needed         Please call the care guide team at [phone] if you need to schedule an appointment with our care coordination team   If you are experiencing a Mental Health or Behavioral Health Crisis or need someone to talk to, please call 1-800-273-TALK (toll free, 24 hour hotline)  Patient verbalizes understanding of instructions and care plan provided today and agrees to view in MyChart. Active MyChart status and patient understanding of how to access instructions and care plan via MyChart confirmed with patient.     No further follow up required: Please contact your primary care provider as needed  Bevelyn Ngo, BSW, CDP Social Worker, Certified Dementia Practitioner Care Coordination [phone]

## 2021-10-14 NOTE — Telephone Encounter (Signed)
Resent and patient notified.

## 2021-10-14 NOTE — Patient Outreach (Signed)
  Care Coordination   Initial Visit Note   10/14/2021 Name: Marcia Paul MRN: [id number] DOB: 08-27-1945  Marcia Paul is a 76 y.o. year old female who sees Ronnald Nian, MD for primary care. I spoke with  [name] by phone today  What matters to the patients health and wellness today?  No concerns, doing well    Goals Addressed             This Visit's Progress    COMPLETED: Care Coordination Activties       Care Coordination Interventions: SDoH screening completed - no acute resource needs identified Performed chart review to note patient has annual wellness visit scheduled 12/27/21 Introduced care coordination program to the patient - no follow up needed at this time Encouraged the patient to speak with her primary care provider as needed        SDOH assessments and interventions completed:  Yes  SDOH Interventions Today    Flowsheet Row Most Recent Value  SDOH Interventions   Food Insecurity Interventions Intervention Not Indicated  Transportation Interventions Intervention Not Indicated        Care Coordination Interventions Activated:  Yes  Care Coordination Interventions:  Yes, provided   Follow up plan: No further intervention required.   Encounter Outcome:  Pt. Visit Completed   Bevelyn Ngo, BSW, CDP Social Worker, Certified Dementia Practitioner Care Coordination [phone]

## 2021-11-06 ENCOUNTER — Encounter: Payer: Self-pay | Admitting: Internal Medicine

## 2021-12-10 ENCOUNTER — Encounter: Payer: Self-pay | Admitting: Internal Medicine

## 2021-12-12 ENCOUNTER — Other Ambulatory Visit: Payer: Self-pay

## 2021-12-12 ENCOUNTER — Other Ambulatory Visit (INDEPENDENT_AMBULATORY_CARE_PROVIDER_SITE_OTHER): Payer: Medicare PPO

## 2021-12-12 DIAGNOSIS — Z23 Encounter for immunization: Secondary | ICD-10-CM | POA: Diagnosis not present

## 2021-12-12 NOTE — Progress Notes (Signed)
Pt came in for the CGM to be applied to her left arm per Dr. Redmond School.SN is [id number] sensor was attached to pt cell phone and she will need to be added to the [place] cite to get info for visit in two weeks. If pt and Dr. Redmond School want to continue the use of this an order will need to be placed with [facility].  Elyse Jarvis RMA

## 2021-12-13 ENCOUNTER — Other Ambulatory Visit: Payer: Medicare PPO

## 2021-12-23 ENCOUNTER — Encounter: Payer: Self-pay | Admitting: Internal Medicine

## 2021-12-24 ENCOUNTER — Telehealth: Payer: Self-pay | Admitting: Family Medicine

## 2021-12-24 NOTE — Telephone Encounter (Signed)
Marcia Paul said you were going to send her an email "invite" for the [employer] system so we can download readings. She has not received it. She is concerned since her sensor is full on Thursday and her appointment is Friday that we will not be able to download the information Her email is [email or web address]

## 2021-12-27 ENCOUNTER — Ambulatory Visit (INDEPENDENT_AMBULATORY_CARE_PROVIDER_SITE_OTHER): Payer: Medicare PPO | Admitting: Family Medicine

## 2021-12-27 ENCOUNTER — Encounter: Payer: Self-pay | Admitting: Family Medicine

## 2021-12-27 VITALS — BP 134/84 | HR 83 | Temp 98.5°F | Ht 67.0 in | Wt >= 6400 oz

## 2021-12-27 DIAGNOSIS — E1169 Type 2 diabetes mellitus with other specified complication: Secondary | ICD-10-CM

## 2021-12-27 DIAGNOSIS — Z794 Long term (current) use of insulin: Secondary | ICD-10-CM

## 2021-12-27 DIAGNOSIS — Z0000 Encounter for general adult medical examination without abnormal findings: Secondary | ICD-10-CM

## 2021-12-27 DIAGNOSIS — E1159 Type 2 diabetes mellitus with other circulatory complications: Secondary | ICD-10-CM

## 2021-12-27 DIAGNOSIS — I152 Hypertension secondary to endocrine disorders: Secondary | ICD-10-CM

## 2021-12-27 DIAGNOSIS — N029 Recurrent and persistent hematuria with unspecified morphologic changes: Secondary | ICD-10-CM | POA: Diagnosis not present

## 2021-12-27 DIAGNOSIS — J309 Allergic rhinitis, unspecified: Secondary | ICD-10-CM | POA: Diagnosis not present

## 2021-12-27 DIAGNOSIS — E118 Type 2 diabetes mellitus with unspecified complications: Secondary | ICD-10-CM

## 2021-12-27 DIAGNOSIS — T466X5D Adverse effect of antihyperlipidemic and antiarteriosclerotic drugs, subsequent encounter: Secondary | ICD-10-CM

## 2021-12-27 DIAGNOSIS — E1136 Type 2 diabetes mellitus with diabetic cataract: Secondary | ICD-10-CM | POA: Diagnosis not present

## 2021-12-27 DIAGNOSIS — Z23 Encounter for immunization: Secondary | ICD-10-CM

## 2021-12-27 DIAGNOSIS — E785 Hyperlipidemia, unspecified: Secondary | ICD-10-CM

## 2021-12-27 LAB — POCT GLYCOSYLATED HEMOGLOBIN (HGB A1C): Hemoglobin A1C: 7.1 % — AB (ref 4.0–5.6)

## 2021-12-27 NOTE — Progress Notes (Signed)
Marcia Paul is a 76 y.o. female who presents for annual wellness visit ,CPE and follow-up on chronic medical conditions.  She is using the freestyle libre continuous glucose monitoring device.  Presently she is taking Janumet 50/1000 as well as 82 units of Levemir per day.  She continues on olmesartan/HCTZ.  She is not taking any sleep meds other than occasional Benadryl.  She is not on a statin as she does have an adverse reaction to that.  She follows up with ophthalmology regularly for her cataract.  She does have underlying allergies and uses OTC medications for that.  They are usually seasonal in nature.  Her weight is unchanged.  She does have a history of benign hematuria.  She seems to be handling the death of her husband fairly well but still does have crying moments.  Otherwise she has no particular concerns or complaints.  Immunizations and Health Maintenance Immunization History  Administered Date(s) Administered   Fluad Quad(high Dose 65+) 10/18/2018, 12/30/2019, 12/13/2020, 12/12/2021   Influenza Split 01/22/2011   Influenza Whole 12/30/2005, 12/11/2006   Influenza, High Dose Seasonal PF 01/11/2014, 11/22/2014, 11/09/2015, 10/31/2016, 01/04/2018   Influenza, Seasonal, Injecte, Preservative Fre 03/22/2012   Influenza,inj,Quad PF,6+ Mos 11/23/2012   Moderna Covid-19 Vaccine Bivalent Booster 52yrs & up 12/13/2020   Moderna Sars-Covid-2 Vaccination 05/10/2019, 06/07/2019, 12/30/2019   Pneumococcal Conjugate-13 04/15/2016   Pneumococcal Polysaccharide-23 05/21/2005   Tdap 04/30/2016   Zoster Recombinat (Shingrix) 04/30/2016, 10/30/2016   Zoster, Live 01/22/2011   Health Maintenance Due  Topic Date Due   Diabetic kidney evaluation - Urine ACR  11/08/2016   COVID-19 Vaccine (5 - Moderna series) 04/15/2021   FOOT EXAM  12/13/2021    Last Pap smear: no clue Last mammogram: 2022 Last colonoscopy: 2015 Last DEXA: 2010 Dentist:yes Ophtho: Dr. Gershon Crane Exercise: not as  much  Other doctors caring for patient include: no other   Advanced directives: Does Patient Have a Medical Advance Directive?: Yes Type of Advance Directive: Healthcare Power of Attorney, Living will Copy of [place] in Chart?: Yes - validated most recent copy scanned in chart (See row information)  Depression screen:  See questionnaire below.     12/27/2021    3:02 PM 06/27/2021    3:19 PM 06/21/2020    2:29 PM 02/09/2020    2:05 PM 10/18/2018   11:16 AM  Depression screen PHQ 2/9  Decreased Interest 0 0 0 0 0  Down, Depressed, Hopeless 0 0 0 0 0  PHQ - 2 Score 0 0 0 0 0    Fall Risk Screen: see questionnaire below.    12/27/2021    3:02 PM 02/09/2020    2:05 PM 02/09/2020    2:04 PM 10/18/2018   11:16 AM 11/03/2017   10:37 AM  Fall Risk   Falls in the past year? 0 0 0 0 No  Number falls in past yr: 0      Injury with Fall? 0      Risk for fall due to : No Fall Risks      Follow up Falls evaluation completed        ADL screen:  See questionnaire below Functional Status Survey: Is the patient deaf or have difficulty hearing?: No Does the patient have difficulty seeing, even when wearing glasses/contacts?: No Does the patient have difficulty concentrating, remembering, or making decisions?: No Does the patient have difficulty walking or climbing stairs?: Yes Does the patient have difficulty dressing or bathing?: No Does  the patient have difficulty doing errands alone such as visiting a doctor's office or shopping?: No   Review of Systems Constitutional: -, -unexpected weight change, -anorexia, -fatigue Dermatology: denies changing moles, rash, lumps Cardiology:  -chest pain, -palpitations, -orthopnea, Respiratory: -cough, -shortness of breath, -dyspnea on exertion, -wheezing,  Gastroenterology: -abdominal pain, -nausea, -vomiting, -diarrhea, -constipation, -dysphagia Hematology: -bleeding or bruising problems Musculoskeletal: -arthralgias, -myalgias,  -joint swelling, -back pain, - Ophthalmology: -vision changes,  Urology: -dysuria, -difficulty urinating,  -urinary frequency, -urgency, incontinence Neurology: -, -numbness, , -memory loss, -falls, -dizziness    PHYSICAL EXAM:  BP 134/84   Pulse 83   Temp 98.5 F (36.9 C)   Ht 5\' 7"  (1.702 m)   Wt (!) 407 lb (184.6 kg)   BMI 63.75 kg/m   General Appearance: Alert, cooperative, no distress, appears stated age Head: Normocephalic, without obvious abnormality, atraumatic Eyes: PERRL, conjunctiva/corneas clear, EOM's intact,  Ears: Normal TM's and external ear canals Nose: Nares normal, mucosa normal, no drainage or sinus tenderness Throat: Lips, mucosa, and tongue normal; teeth and gums normal Neck: Supple, no lymphadenopathy;  thyroid:  no enlargement/tenderness/nodules; no carotid bruit or JVD Lungs: Clear to auscultation bilaterally without wheezes, rales or ronchi; respirations unlabored Heart: Regular rate and rhythm, S1 and S2 normal, no murmur, rubor gallop Abdomen: Soft, non-tender, nondistended, normoactive bowel sounds,  no masses, no hepatosplenomegaly Skin:  Skin color, texture, turgor normal, no rashes or lesions Lymph nodes: Cervical, supraclavicular, and axillary nodes normal Neurologic:  CNII-XII intact, normal strength, sensation and gait; reflexes 2+ and symmetric throughout Psych: Normal mood, affect, hygiene and grooming. Hemoglobin A1c is 7.1 review of her blood sugars indicates that she is keeping her sugars in a good range but she also admits that she really does not eat much during the day.  I will reassess this after we get another 2 weeks with her readings out of this. ASSESSMENT/PLAN: Routine general medical examination at a health care facility  Hypertension associated with diabetes ([place])  Allergic rhinitis, unspecified seasonality, unspecified trigger  Diabetic cataract, associated with type 2 diabetes mellitus ([place]) - Plan: POCT glycosylated  hemoglobin (Hb A1C)  Hyperlipidemia associated with type 2 diabetes mellitus ([place])  Type 2 diabetes mellitus with complication, with long-term current use of insulin (HCC)  Benign hematuria  Morbid obesity, unspecified obesity type ([place])  Adverse effect of antihyperlipidemic drug, subsequent encounter  Need for COVID-19 vaccine - Plan: Pfizer Fall 2023 Covid-19 Vaccine 61yrs and older Discussed possibly switching her to a GLP/insulin preparation but she was ambivalent to this.  Explained that this might help her with weight loss. Discussed aerobic activity at least 5 days/week she is not at all interested in increasing her physical activity.  At this point she seems to be having difficulty with anhedonia and has really no desire to interact with humanity in general.  Try to discuss things that she can do positively for herself by getting involved with other likeminded individuals in regard to reading books, knitting/crocheting etc.  Immunization recommendations discussed.  Colonoscopy recommendations reviewed   Medicare Attestation I have personally reviewed: The patient's medical and social history Their use of alcohol, tobacco or illicit drugs Their current medications and supplements The patient's functional ability including ADLs,fall risks, home safety risks, cognitive, and hearing and visual impairment Diet and physical activities Evidence for depression or mood disorders  The patient's weight, height, and BMI have been recorded in the chart.  I have made referrals, counseling, and provided education to the patient based  on review of the above and I have provided the patient with a written personalized care plan for preventive services.     Jill Alexanders, MD   12/27/2021

## 2022-01-01 ENCOUNTER — Ambulatory Visit (INDEPENDENT_AMBULATORY_CARE_PROVIDER_SITE_OTHER): Payer: Medicare PPO | Admitting: Family Medicine

## 2022-01-01 ENCOUNTER — Encounter: Payer: Self-pay | Admitting: Family Medicine

## 2022-01-01 ENCOUNTER — Other Ambulatory Visit: Payer: Self-pay | Admitting: Family Medicine

## 2022-01-01 VITALS — Ht 67.0 in | Wt >= 6400 oz

## 2022-01-01 DIAGNOSIS — E118 Type 2 diabetes mellitus with unspecified complications: Secondary | ICD-10-CM

## 2022-01-01 DIAGNOSIS — Z794 Long term (current) use of insulin: Secondary | ICD-10-CM | POA: Diagnosis not present

## 2022-01-01 MED ORDER — FREESTYLE LIBRE 2 SENSOR MISC: 1.0000 | 5 refills | Status: DC

## 2022-01-01 NOTE — Progress Notes (Signed)
   Subjective:    Patient ID: Marcia Paul, female    DOB: 1945/06/05, 76 y.o.   MRN: [id number]  HPI Documentation for virtual audio telecommunications through [place] encounter: She does not have access to video communication The patient was located at home. 2 patient identifiers used.  The provider was located in the office. The patient did consent to this visit and is aware of possible charges through their insurance for this visit. The other persons participating in this telemedicine service were none. Time spent on call was 5 minutes and in review of previous records >15 minutes total for counseling and coordination of care. This virtual service is not related to other E/M service within previous 7 days.  Her insurance will change next year and she will need to change to Lantus insulin.  I explained that that would be fine. She states that her freestyle Elenor Legato has been having low readings in the 60s early in the morning.  When asked her how she responded to that she was unclear as to how to interpret this.  Review of Systems     Objective:   Physical Exam Alert and in no distress otherwise not examined       Assessment & Plan:  Type 2 diabetes mellitus with complication, with long-term current use of insulin ([place]) - Plan: Continuous Blood Gluc Sensor (FREESTYLE LIBRE 2 SENSOR) MISC I explained that if her blood sugar is low in the morning the first question that she should be concerned with his her eating habits prior to that indicating that she probably did not eat what she normally eats.  Her eating habits are quite suspect.  She states that she eats just 1 meal a day and is usually in the evenings.  After discussion with her I recommend that she cut down to 78 units of insulin and monitor her blood sugar.  If it remains low then we will again drop down by several more units.  She does state that she is going to try to become more physically active and is doing some video  exercise routines.  I will call in the freestyle libre and she is to follow-up with [place] in 1 month.

## 2022-01-07 ENCOUNTER — Encounter: Payer: Self-pay | Admitting: Family Medicine

## 2022-01-17 DIAGNOSIS — E118 Type 2 diabetes mellitus with unspecified complications: Secondary | ICD-10-CM | POA: Diagnosis not present

## 2022-01-17 DIAGNOSIS — Z794 Long term (current) use of insulin: Secondary | ICD-10-CM | POA: Diagnosis not present

## 2022-02-11 ENCOUNTER — Telehealth (INDEPENDENT_AMBULATORY_CARE_PROVIDER_SITE_OTHER): Payer: Medicare PPO | Admitting: Family Medicine

## 2022-02-11 ENCOUNTER — Encounter: Payer: Self-pay | Admitting: Family Medicine

## 2022-02-11 VITALS — Temp 97.8°F | Ht 67.0 in | Wt >= 6400 oz

## 2022-02-11 DIAGNOSIS — E118 Type 2 diabetes mellitus with unspecified complications: Secondary | ICD-10-CM

## 2022-02-11 DIAGNOSIS — Z794 Long term (current) use of insulin: Secondary | ICD-10-CM | POA: Diagnosis not present

## 2022-02-11 NOTE — Progress Notes (Signed)
   Subjective:    Patient ID: Marcia Paul, female    DOB: 03-29-1945, 76 y.o.   MRN: [id number]  HPI Documentation for virtual audio telecommunications through [facility] encounter: The patient was located at home. 2 patient identifiers used.  The provider was located in the office. The patient did consent to this visit and is aware of possible charges through their insurance for this visit. The other persons participating in this telemedicine service were none. Time spent on call was 5 minutes and in review of previous records >20 minutes total for counseling and coordination of care. This virtual service is not related to other E/M service within previous 7 days.  Today's visit is discussed the freestyle [employer].  We got a 2-week readout on this in a discussed this with her in detail.  Review of Systems     Objective:   Physical Exam Alert and in no distress otherwise not examined       Assessment & Plan:  Type 2 diabetes mellitus with complication, with long-term current use of insulin (HCC) Review of her record indicates that for the last 2 weeks she has been keeping her blood sugars in the appropriate gray zone area fairly regularly.  She has made some small diet and exercise changes and feels good about this.  She also has reduced her Levemir dosing just slightly to keep it in this range.  She feels very good about this and likes the idea of getting immediate feedback.  Review of the information indicates that her blood sugar should be in 6.6 range.  She will continue with his diet and exercise regimen and recheck here in 3 months or sooner if she has difficulty.

## 2022-02-20 ENCOUNTER — Encounter: Payer: Self-pay | Admitting: Family Medicine

## 2022-03-12 ENCOUNTER — Other Ambulatory Visit: Payer: Self-pay

## 2022-03-12 ENCOUNTER — Other Ambulatory Visit: Payer: Self-pay | Admitting: Family Medicine

## 2022-03-12 MED ORDER — BLOOD GLUCOSE TEST VI STRP: 1.0000 | ORAL_STRIP | Freq: Three times a day (TID) | 2 refills | Status: DC

## 2022-03-12 MED ORDER — BD PEN NEEDLE NANO 2ND GEN 32G X 4 MM MISC: 3.0000 | Freq: Three times a day (TID) | 1 refills | Status: DC

## 2022-03-17 ENCOUNTER — Other Ambulatory Visit: Payer: Self-pay

## 2022-03-17 MED ORDER — ACCU-CHEK AVIVA PLUS W/DEVICE KIT: 1.0000 | PACK | Freq: Three times a day (TID) | 0 refills | Status: AC

## 2022-04-17 DIAGNOSIS — E118 Type 2 diabetes mellitus with unspecified complications: Secondary | ICD-10-CM | POA: Diagnosis not present

## 2022-04-17 DIAGNOSIS — Z794 Long term (current) use of insulin: Secondary | ICD-10-CM | POA: Diagnosis not present

## 2022-05-06 ENCOUNTER — Other Ambulatory Visit: Payer: Self-pay | Admitting: *Deleted

## 2022-05-06 ENCOUNTER — Telehealth: Payer: Self-pay | Admitting: *Deleted

## 2022-05-06 DIAGNOSIS — E1159 Type 2 diabetes mellitus with other circulatory complications: Secondary | ICD-10-CM

## 2022-05-06 DIAGNOSIS — Z794 Long term (current) use of insulin: Secondary | ICD-10-CM

## 2022-05-06 MED ORDER — OLMESARTAN MEDOXOMIL-HCTZ 40-25 MG PO TABS: 1.0000 | ORAL_TABLET | Freq: Every day | ORAL | 0 refills | Status: DC

## 2022-05-06 MED ORDER — JANUMET 50-1000 MG PO TABS: 1.0000 | ORAL_TABLET | Freq: Two times a day (BID) | ORAL | 0 refills | Status: DC

## 2022-05-06 NOTE — Telephone Encounter (Signed)
Patient's insurance no longer covers Levemir, now needs to be Lantus Solostar-she said you are aware and told her to call when she was ready to make the change. Needs to be sent to [facility].

## 2022-05-07 ENCOUNTER — Other Ambulatory Visit: Payer: Self-pay | Admitting: *Deleted

## 2022-05-07 ENCOUNTER — Other Ambulatory Visit: Payer: Self-pay

## 2022-05-07 ENCOUNTER — Telehealth: Payer: Self-pay

## 2022-05-07 MED ORDER — LANTUS SOLOSTAR 100 UNIT/ML ~~LOC~~ SOPN: 76.0000 [IU] | PEN_INJECTOR | Freq: Every day | SUBCUTANEOUS | 1 refills | Status: DC

## 2022-05-07 NOTE — Telephone Encounter (Signed)
Done

## 2022-05-07 NOTE — Telephone Encounter (Signed)
Pt called regarding lantus. Adam changed it to 90 day supply. Per her insurance, it is the same price as 30 day supply. Pt called back stating there was an issue at the pharmacy with the lantus and she will call her insurance to see what's going on and call us back.

## 2022-06-19 ENCOUNTER — Encounter: Payer: Medicare PPO | Admitting: Family Medicine

## 2022-06-26 ENCOUNTER — Encounter: Payer: Medicare PPO | Admitting: Family Medicine

## 2022-07-01 ENCOUNTER — Telehealth: Payer: Self-pay | Admitting: Family Medicine

## 2022-07-01 NOTE — Telephone Encounter (Signed)
Marcia Paul called from [facility], she says that Marcia Paul shows that she may have diabetes and is recommended to be on a stat medication. They said they will also fax over information about this Provided call back number [phone]

## 2022-07-03 DIAGNOSIS — H524 Presbyopia: Secondary | ICD-10-CM | POA: Diagnosis not present

## 2022-07-03 DIAGNOSIS — Z794 Long term (current) use of insulin: Secondary | ICD-10-CM | POA: Diagnosis not present

## 2022-07-03 DIAGNOSIS — H52203 Unspecified astigmatism, bilateral: Secondary | ICD-10-CM | POA: Diagnosis not present

## 2022-07-03 DIAGNOSIS — H2513 Age-related nuclear cataract, bilateral: Secondary | ICD-10-CM | POA: Insufficient documentation

## 2022-07-03 DIAGNOSIS — Z7984 Long term (current) use of oral hypoglycemic drugs: Secondary | ICD-10-CM | POA: Diagnosis not present

## 2022-07-03 DIAGNOSIS — H25813 Combined forms of age-related cataract, bilateral: Secondary | ICD-10-CM | POA: Diagnosis not present

## 2022-07-03 DIAGNOSIS — E1136 Type 2 diabetes mellitus with diabetic cataract: Secondary | ICD-10-CM | POA: Diagnosis not present

## 2022-07-03 DIAGNOSIS — H5203 Hypermetropia, bilateral: Secondary | ICD-10-CM | POA: Diagnosis not present

## 2022-07-03 LAB — HM DIABETES EYE EXAM

## 2022-07-16 DIAGNOSIS — E118 Type 2 diabetes mellitus with unspecified complications: Secondary | ICD-10-CM | POA: Diagnosis not present

## 2022-07-16 DIAGNOSIS — Z794 Long term (current) use of insulin: Secondary | ICD-10-CM | POA: Diagnosis not present

## 2022-08-06 ENCOUNTER — Other Ambulatory Visit: Payer: Self-pay | Admitting: Family Medicine

## 2022-08-06 DIAGNOSIS — I152 Hypertension secondary to endocrine disorders: Secondary | ICD-10-CM

## 2022-08-06 DIAGNOSIS — Z794 Long term (current) use of insulin: Secondary | ICD-10-CM

## 2022-08-06 DIAGNOSIS — E1159 Type 2 diabetes mellitus with other circulatory complications: Secondary | ICD-10-CM

## 2022-08-06 DIAGNOSIS — E118 Type 2 diabetes mellitus with unspecified complications: Secondary | ICD-10-CM

## 2022-08-07 ENCOUNTER — Ambulatory Visit: Payer: Medicare PPO | Admitting: Family Medicine

## 2022-08-07 ENCOUNTER — Encounter: Payer: Self-pay | Admitting: Family Medicine

## 2022-08-07 VITALS — BP 142/80 | HR 84 | Temp 97.9°F | Resp 20 | Wt >= 6400 oz

## 2022-08-07 DIAGNOSIS — E118 Type 2 diabetes mellitus with unspecified complications: Secondary | ICD-10-CM

## 2022-08-07 DIAGNOSIS — E1159 Type 2 diabetes mellitus with other circulatory complications: Secondary | ICD-10-CM | POA: Diagnosis not present

## 2022-08-07 DIAGNOSIS — E785 Hyperlipidemia, unspecified: Secondary | ICD-10-CM

## 2022-08-07 DIAGNOSIS — J309 Allergic rhinitis, unspecified: Secondary | ICD-10-CM | POA: Diagnosis not present

## 2022-08-07 DIAGNOSIS — Z794 Long term (current) use of insulin: Secondary | ICD-10-CM | POA: Diagnosis not present

## 2022-08-07 DIAGNOSIS — E1136 Type 2 diabetes mellitus with diabetic cataract: Secondary | ICD-10-CM | POA: Diagnosis not present

## 2022-08-07 DIAGNOSIS — I152 Hypertension secondary to endocrine disorders: Secondary | ICD-10-CM

## 2022-08-07 DIAGNOSIS — T466X5D Adverse effect of antihyperlipidemic and antiarteriosclerotic drugs, subsequent encounter: Secondary | ICD-10-CM | POA: Diagnosis not present

## 2022-08-07 DIAGNOSIS — H2513 Age-related nuclear cataract, bilateral: Secondary | ICD-10-CM

## 2022-08-07 DIAGNOSIS — Z7189 Other specified counseling: Secondary | ICD-10-CM

## 2022-08-07 DIAGNOSIS — E1169 Type 2 diabetes mellitus with other specified complication: Secondary | ICD-10-CM | POA: Diagnosis not present

## 2022-08-07 LAB — POCT GLYCOSYLATED HEMOGLOBIN (HGB A1C): Hemoglobin A1C: 7.1 % — AB (ref 4.0–5.6)

## 2022-08-07 NOTE — Progress Notes (Signed)
Subjective:    Patient ID: Marcia Paul, female    DOB: [birth year], 77 y.o.   MRN: [id number]  Marcia Paul is a 77 y.o. female who presents for follow-up of Type 2 diabetes mellitus.  Home blood sugar records:  has a CGM Current symptoms/problems include hypoglycemia (blood sugar of 53 this morning) and paresthesia of the feet and have been stable. Daily foot checks:yes   Any foot concerns: swelling in feet How often blood sugars checked: has CGM Exercise: The patient does not participate in regular exercise at present. She is taking 76 units of Lantus and has been making some minor dietary changes.  She continues on Janumet.  She is also taking olmesartan/HCTZ.  She cannot tolerate statins.  Her allergies are under good control.  She still is having difficulty dealing with the fact that her husband died approximately 2 years ago.  She is doing better but does still have periods of melancholy and crying spells. Diet: regular  The following portions of the patient's history were reviewed and updated as appropriate: allergies, current medications, past medical history, past social history and problem list.  ROS as in subjective above.     Objective:    Physical Exam Alert and in no distress otherwise not examined. Hemoglobin A1c is 7.1 feet not examined at patient request Blood pressure (!) 142/80, pulse 84, temperature 97.9 F (36.6 C), temperature source Oral, resp. rate 20, weight (!) 417 lb 9.6 oz (189.4 kg), SpO2 97 %.  Lab Review    Latest Ref Rng & Units 08/07/2022    4:06 PM 12/27/2021    3:20 PM 06/27/2021    4:09 PM 06/27/2021    1:48 PM 12/13/2020    1:14 PM  Diabetic Labs  HbA1c 4.0 - 5.6 % 7.1  7.1   6.9  6.8   Chol 100 - 199 mg/dL   409     HDL >81 mg/dL   64     Calc LDL 0 - 99 mg/dL   191     Triglycerides 0 - 149 mg/dL   478     Creatinine 2.95 - 1.00 mg/dL   6.21         3/0/8657    3:55 PM 08/07/2022    3:47 PM 02/11/2022    3:05 PM 01/01/2022     1:04 PM 12/27/2021    3:11 PM  BP/Weight  Systolic BP 142 148   134  Diastolic BP 80 90   84  Wt. (Lbs)  417.6 400.1 402 407  BMI  65.41 kg/m2 62.66 kg/m2 62.96 kg/m2 63.75 kg/m2      Latest Ref Rng & Units 06/20/2021   12:00 AM 12/13/2020    3:45 PM  Foot/eye exam completion dates  Eye Exam No Retinopathy No Retinopathy       Foot Form Completion   Done     This result is from an external source.    Dollie  reports that she quit smoking about 8 years ago. Her smoking use included cigarettes. She smoked an average of 1 pack per day. She has never used smokeless tobacco. She reports current alcohol use. She reports that she does not use drugs.     Assessment & Plan:    Type 2 diabetes mellitus with complication, with long-term current use of insulin (HCC) - Plan: CBC with Differential/Platelet, Comprehensive metabolic panel, Lipid panel, POCT glycosylated hemoglobin (Hb A1C), POCT UA - Microalbumin  Hypertension associated with diabetes (HCC) -  Plan: CBC with Differential/Platelet, Comprehensive metabolic panel  Allergic rhinitis, unspecified seasonality, unspecified trigger  Diabetic cataract, associated with type 2 diabetes mellitus (HCC)  Hyperlipidemia associated with type 2 diabetes mellitus (HCC) - Plan: Lipid panel  Morbid obesity, unspecified obesity type (HCC)  Adverse effect of antihyperlipidemic drug, subsequent encounter  Nuclear sclerotic cataract of both eyes  Bereavement counseling Recommend she cut her Lantus to 72 units.  She will continue on her other medications.  Discussed the possibility at some point in the future switching her to [facility] however that would be a challenge especially with her Lantus at such a high level.  Recheck all this in approximately 6 months.   I then discussed readings with her.  She seems to be making progress but it has been 2 years and I think counseling to help fine-tune this further would be reasonable.  Information concerning  [facility] was given.

## 2022-08-08 LAB — LIPID PANEL
Chol/HDL Ratio: 2.7 ratio (ref 0.0–4.4)
Cholesterol, Total: 194 mg/dL (ref 100–199)
HDL: 73 mg/dL (ref >39)
LDL Chol Calc (NIH): 106 mg/dL — ABNORMAL HIGH (ref 0–99)
Triglycerides: 85 mg/dL (ref 0–149)
VLDL Cholesterol Cal: 15 mg/dL (ref 5–40)

## 2022-08-08 LAB — CBC WITH DIFFERENTIAL/PLATELET
Basophils Absolute: 0 10*3/uL (ref 0.0–0.2)
Basos: 0 %
EOS (ABSOLUTE): 0.1 10*3/uL (ref 0.0–0.4)
Eos: 1 %
Hematocrit: 40.9 % (ref 34.0–46.6)
Hemoglobin: 13.4 g/dL (ref 11.1–15.9)
Immature Grans (Abs): 0 10*3/uL (ref 0.0–0.1)
Immature Granulocytes: 0 %
Lymphocytes Absolute: 1.5 10*3/uL (ref 0.7–3.1)
Lymphs: 16 %
MCH: 28.8 pg (ref 26.6–33.0)
MCHC: 32.8 g/dL (ref 31.5–35.7)
MCV: 88 fL (ref 79–97)
Monocytes Absolute: 0.5 10*3/uL (ref 0.1–0.9)
Monocytes: 6 %
Neutrophils Absolute: 7.2 10*3/uL — ABNORMAL HIGH (ref 1.4–7.0)
Neutrophils: 77 %
Platelets: 274 10*3/uL (ref 150–450)
RBC: 4.66 x10E6/uL (ref 3.77–5.28)
RDW: 13.2 % (ref 11.7–15.4)
WBC: 9.4 10*3/uL (ref 3.4–10.8)

## 2022-08-08 LAB — COMPREHENSIVE METABOLIC PANEL
ALT: 24 IU/L (ref 0–32)
AST: 41 IU/L — ABNORMAL HIGH (ref 0–40)
Albumin/Globulin Ratio: 1.6 (ref 1.2–2.2)
Albumin: 4.4 g/dL (ref 3.8–4.8)
Alkaline Phosphatase: 114 IU/L (ref 44–121)
BUN/Creatinine Ratio: 20 (ref 12–28)
BUN: 18 mg/dL (ref 8–27)
Bilirubin Total: 0.4 mg/dL (ref 0.0–1.2)
CO2: 26 mmol/L (ref 20–29)
Calcium: 11 mg/dL — ABNORMAL HIGH (ref 8.7–10.3)
Chloride: 100 mmol/L (ref 96–106)
Creatinine, Ser: 0.92 mg/dL (ref 0.57–1.00)
Globulin, Total: 2.7 g/dL (ref 1.5–4.5)
Glucose: 104 mg/dL — ABNORMAL HIGH (ref 70–99)
Potassium: 4.8 mmol/L (ref 3.5–5.2)
Sodium: 139 mmol/L (ref 134–144)
Total Protein: 7.1 g/dL (ref 6.0–8.5)
eGFR: 65 mL/min/{1.73_m2} (ref >59)

## 2022-10-15 ENCOUNTER — Other Ambulatory Visit: Payer: Self-pay | Admitting: Family Medicine

## 2022-10-15 ENCOUNTER — Encounter: Payer: Self-pay | Admitting: Family Medicine

## 2022-10-15 DIAGNOSIS — G729 Myopathy, unspecified: Secondary | ICD-10-CM | POA: Insufficient documentation

## 2022-10-21 DIAGNOSIS — Z794 Long term (current) use of insulin: Secondary | ICD-10-CM | POA: Diagnosis not present

## 2022-10-21 DIAGNOSIS — E118 Type 2 diabetes mellitus with unspecified complications: Secondary | ICD-10-CM | POA: Diagnosis not present

## 2022-11-10 ENCOUNTER — Other Ambulatory Visit: Payer: Self-pay | Admitting: Family Medicine

## 2022-11-10 ENCOUNTER — Telehealth: Payer: Self-pay | Admitting: Family Medicine

## 2022-11-10 DIAGNOSIS — E1159 Type 2 diabetes mellitus with other circulatory complications: Secondary | ICD-10-CM

## 2022-11-10 NOTE — Telephone Encounter (Signed)
Marcia Paul wants you to call her when you get back in the office sometime about an issues with a rx, She said you helped her with something similar in the past

## 2022-11-11 ENCOUNTER — Telehealth: Payer: Self-pay | Admitting: Family Medicine

## 2022-11-11 NOTE — Telephone Encounter (Signed)
Pt called she is having issues again with pharmacy sending out meds she didn't order and saying we ordered it.  I advised her [facility] now has mail order and can deliver.  I will send her out  a coupon for [facility] and she will decide.

## 2022-11-11 NOTE — Telephone Encounter (Signed)
Called Sonita reached vm, lmtrc.

## 2022-11-26 ENCOUNTER — Other Ambulatory Visit: Payer: Self-pay | Admitting: Family Medicine

## 2022-11-26 DIAGNOSIS — Z794 Long term (current) use of insulin: Secondary | ICD-10-CM

## 2023-01-07 ENCOUNTER — Encounter: Payer: Self-pay | Admitting: Family Medicine

## 2023-01-08 ENCOUNTER — Telehealth: Payer: Medicare PPO | Admitting: Medical

## 2023-01-08 DIAGNOSIS — R2689 Other abnormalities of gait and mobility: Secondary | ICD-10-CM

## 2023-01-08 DIAGNOSIS — M25551 Pain in right hip: Secondary | ICD-10-CM

## 2023-01-08 DIAGNOSIS — R531 Weakness: Secondary | ICD-10-CM

## 2023-01-08 DIAGNOSIS — M25552 Pain in left hip: Secondary | ICD-10-CM | POA: Diagnosis not present

## 2023-01-08 DIAGNOSIS — Z9989 Dependence on other enabling machines and devices: Secondary | ICD-10-CM | POA: Diagnosis not present

## 2023-01-08 NOTE — Addendum Note (Signed)
Addended by: Herminio Commons A on: 01/08/2023 11:01 AM   Modules accepted: Orders
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
## 2023-01-08 NOTE — Progress Notes (Signed)
I have put in order for PT

## 2023-01-12 ENCOUNTER — Telehealth: Payer: Self-pay | Admitting: Family Medicine

## 2023-01-12 NOTE — Telephone Encounter (Signed)
Please call about PT/Home Health  confusion about order

## 2023-01-13 NOTE — Telephone Encounter (Signed)
Pt called and states that she got a call from [facility] and was told she needed a nurse visit first. I believe this was for like a nurse evaluation to come out to access on why she needs PT before PT will just come out and start. Can you look into this

## 2023-01-14 DIAGNOSIS — E119 Type 2 diabetes mellitus without complications: Secondary | ICD-10-CM | POA: Diagnosis not present

## 2023-01-14 DIAGNOSIS — G8929 Other chronic pain: Secondary | ICD-10-CM | POA: Diagnosis not present

## 2023-01-14 DIAGNOSIS — K759 Inflammatory liver disease, unspecified: Secondary | ICD-10-CM | POA: Diagnosis not present

## 2023-01-14 DIAGNOSIS — M79662 Pain in left lower leg: Secondary | ICD-10-CM | POA: Diagnosis not present

## 2023-01-14 DIAGNOSIS — M25552 Pain in left hip: Secondary | ICD-10-CM | POA: Diagnosis not present

## 2023-01-14 DIAGNOSIS — I10 Essential (primary) hypertension: Secondary | ICD-10-CM | POA: Diagnosis not present

## 2023-01-14 DIAGNOSIS — M25551 Pain in right hip: Secondary | ICD-10-CM | POA: Diagnosis not present

## 2023-01-14 DIAGNOSIS — D649 Anemia, unspecified: Secondary | ICD-10-CM | POA: Diagnosis not present

## 2023-01-14 DIAGNOSIS — M79661 Pain in right lower leg: Secondary | ICD-10-CM | POA: Diagnosis not present

## 2023-01-17 NOTE — Telephone Encounter (Signed)
 Care team updated

## 2023-01-20 DIAGNOSIS — I10 Essential (primary) hypertension: Secondary | ICD-10-CM | POA: Diagnosis not present

## 2023-01-20 DIAGNOSIS — E118 Type 2 diabetes mellitus with unspecified complications: Secondary | ICD-10-CM | POA: Diagnosis not present

## 2023-01-20 DIAGNOSIS — K759 Inflammatory liver disease, unspecified: Secondary | ICD-10-CM | POA: Diagnosis not present

## 2023-01-20 DIAGNOSIS — D649 Anemia, unspecified: Secondary | ICD-10-CM | POA: Diagnosis not present

## 2023-01-20 DIAGNOSIS — G8929 Other chronic pain: Secondary | ICD-10-CM | POA: Diagnosis not present

## 2023-01-20 DIAGNOSIS — E119 Type 2 diabetes mellitus without complications: Secondary | ICD-10-CM | POA: Diagnosis not present

## 2023-01-20 DIAGNOSIS — M25552 Pain in left hip: Secondary | ICD-10-CM | POA: Diagnosis not present

## 2023-01-20 DIAGNOSIS — Z794 Long term (current) use of insulin: Secondary | ICD-10-CM | POA: Diagnosis not present

## 2023-01-20 DIAGNOSIS — M79662 Pain in left lower leg: Secondary | ICD-10-CM | POA: Diagnosis not present

## 2023-01-20 DIAGNOSIS — M25551 Pain in right hip: Secondary | ICD-10-CM | POA: Diagnosis not present

## 2023-01-20 DIAGNOSIS — M79661 Pain in right lower leg: Secondary | ICD-10-CM | POA: Diagnosis not present

## 2023-01-23 DIAGNOSIS — M25552 Pain in left hip: Secondary | ICD-10-CM | POA: Diagnosis not present

## 2023-01-23 DIAGNOSIS — M25551 Pain in right hip: Secondary | ICD-10-CM | POA: Diagnosis not present

## 2023-01-23 DIAGNOSIS — G8929 Other chronic pain: Secondary | ICD-10-CM | POA: Diagnosis not present

## 2023-01-23 DIAGNOSIS — E119 Type 2 diabetes mellitus without complications: Secondary | ICD-10-CM | POA: Diagnosis not present

## 2023-01-23 DIAGNOSIS — M79661 Pain in right lower leg: Secondary | ICD-10-CM | POA: Diagnosis not present

## 2023-01-23 DIAGNOSIS — K759 Inflammatory liver disease, unspecified: Secondary | ICD-10-CM | POA: Diagnosis not present

## 2023-01-23 DIAGNOSIS — D649 Anemia, unspecified: Secondary | ICD-10-CM | POA: Diagnosis not present

## 2023-01-23 DIAGNOSIS — M79662 Pain in left lower leg: Secondary | ICD-10-CM | POA: Diagnosis not present

## 2023-01-23 DIAGNOSIS — I10 Essential (primary) hypertension: Secondary | ICD-10-CM | POA: Diagnosis not present

## 2023-01-27 ENCOUNTER — Ambulatory Visit: Payer: Medicare PPO

## 2023-01-27 DIAGNOSIS — Z0000 Encounter for general adult medical examination without abnormal findings: Secondary | ICD-10-CM

## 2023-01-27 NOTE — Patient Instructions (Signed)
Marcia Paul , Thank you for taking time to come for your Medicare Wellness Visit. I appreciate your ongoing commitment to your health goals. Please review the following plan we discussed and let me know if I can assist you in the future.   Referrals/Orders/Follow-Ups/Clinician Recommendations: none  This is a list of the screening recommended for you and due dates:  Health Maintenance  Topic Date Due   Yearly kidney health urinalysis for diabetes  11/08/2016   Complete foot exam   12/13/2021   Flu Shot  10/02/2022   COVID-19 Vaccine (6 - 2023-24 season) 11/02/2022   Pneumonia Vaccine (3 of 3 - PPSV23 or PCV20) 06/29/2028*   Hemoglobin A1C  02/06/2023   Eye exam for diabetics  07/03/2023   Yearly kidney function blood test for diabetes  08/07/2023   Medicare Annual Wellness Visit  01/27/2024   Colon Cancer Screening  02/09/2024   DTaP/Tdap/Td vaccine (2 - Td or Tdap) 04/30/2026   DEXA scan (bone density measurement)  Completed   Hepatitis C Screening  Completed   Zoster (Shingles) Vaccine  Completed   HPV Vaccine  Aged Out  *Topic was postponed. The date shown is not the original due date.    Advanced directives: (In Chart) A copy of your advanced directives are scanned into your chart should your provider ever need it.  Next Medicare Annual Wellness Visit scheduled for next year: Yes  Insert Preventive Care attachment Insert FALL PREVENTION attachment if needed
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
## 2023-02-02 DIAGNOSIS — D649 Anemia, unspecified: Secondary | ICD-10-CM | POA: Diagnosis not present

## 2023-02-02 DIAGNOSIS — M25552 Pain in left hip: Secondary | ICD-10-CM | POA: Diagnosis not present

## 2023-02-02 DIAGNOSIS — I10 Essential (primary) hypertension: Secondary | ICD-10-CM | POA: Diagnosis not present

## 2023-02-02 DIAGNOSIS — E119 Type 2 diabetes mellitus without complications: Secondary | ICD-10-CM | POA: Diagnosis not present

## 2023-02-02 DIAGNOSIS — M25551 Pain in right hip: Secondary | ICD-10-CM | POA: Diagnosis not present

## 2023-02-02 DIAGNOSIS — M79662 Pain in left lower leg: Secondary | ICD-10-CM | POA: Diagnosis not present

## 2023-02-02 DIAGNOSIS — G8929 Other chronic pain: Secondary | ICD-10-CM | POA: Diagnosis not present

## 2023-02-02 DIAGNOSIS — M79661 Pain in right lower leg: Secondary | ICD-10-CM | POA: Diagnosis not present

## 2023-02-02 DIAGNOSIS — K759 Inflammatory liver disease, unspecified: Secondary | ICD-10-CM | POA: Diagnosis not present

## 2023-02-05 DIAGNOSIS — G8929 Other chronic pain: Secondary | ICD-10-CM | POA: Diagnosis not present

## 2023-02-05 DIAGNOSIS — K759 Inflammatory liver disease, unspecified: Secondary | ICD-10-CM | POA: Diagnosis not present

## 2023-02-05 DIAGNOSIS — I10 Essential (primary) hypertension: Secondary | ICD-10-CM | POA: Diagnosis not present

## 2023-02-05 DIAGNOSIS — M79662 Pain in left lower leg: Secondary | ICD-10-CM | POA: Diagnosis not present

## 2023-02-05 DIAGNOSIS — M25552 Pain in left hip: Secondary | ICD-10-CM | POA: Diagnosis not present

## 2023-02-05 DIAGNOSIS — M25551 Pain in right hip: Secondary | ICD-10-CM | POA: Diagnosis not present

## 2023-02-05 DIAGNOSIS — D649 Anemia, unspecified: Secondary | ICD-10-CM | POA: Diagnosis not present

## 2023-02-05 DIAGNOSIS — M79661 Pain in right lower leg: Secondary | ICD-10-CM | POA: Diagnosis not present

## 2023-02-05 DIAGNOSIS — E119 Type 2 diabetes mellitus without complications: Secondary | ICD-10-CM | POA: Diagnosis not present

## 2023-02-10 DIAGNOSIS — M25552 Pain in left hip: Secondary | ICD-10-CM | POA: Diagnosis not present

## 2023-02-10 DIAGNOSIS — M79661 Pain in right lower leg: Secondary | ICD-10-CM | POA: Diagnosis not present

## 2023-02-10 DIAGNOSIS — D649 Anemia, unspecified: Secondary | ICD-10-CM | POA: Diagnosis not present

## 2023-02-10 DIAGNOSIS — G8929 Other chronic pain: Secondary | ICD-10-CM | POA: Diagnosis not present

## 2023-02-10 DIAGNOSIS — M25551 Pain in right hip: Secondary | ICD-10-CM | POA: Diagnosis not present

## 2023-02-10 DIAGNOSIS — K759 Inflammatory liver disease, unspecified: Secondary | ICD-10-CM | POA: Diagnosis not present

## 2023-02-10 DIAGNOSIS — I10 Essential (primary) hypertension: Secondary | ICD-10-CM | POA: Diagnosis not present

## 2023-02-10 DIAGNOSIS — M79662 Pain in left lower leg: Secondary | ICD-10-CM | POA: Diagnosis not present

## 2023-02-10 DIAGNOSIS — E119 Type 2 diabetes mellitus without complications: Secondary | ICD-10-CM | POA: Diagnosis not present

## 2023-02-11 ENCOUNTER — Other Ambulatory Visit: Payer: Self-pay | Admitting: Family Medicine

## 2023-02-11 DIAGNOSIS — E1159 Type 2 diabetes mellitus with other circulatory complications: Secondary | ICD-10-CM

## 2023-02-11 NOTE — Telephone Encounter (Signed)
Called pt to see if she need before 02/12/23 appt, no answer, left voicemail.

## 2023-02-12 ENCOUNTER — Other Ambulatory Visit (HOSPITAL_COMMUNITY): Payer: Self-pay

## 2023-02-12 ENCOUNTER — Encounter: Payer: Self-pay | Admitting: Family Medicine

## 2023-02-12 ENCOUNTER — Ambulatory Visit: Payer: Medicare PPO | Admitting: Family Medicine

## 2023-02-12 ENCOUNTER — Telehealth: Payer: Self-pay

## 2023-02-12 VITALS — BP 142/88 | HR 52 | Ht 70.0 in | Wt >= 6400 oz

## 2023-02-12 DIAGNOSIS — E1136 Type 2 diabetes mellitus with diabetic cataract: Secondary | ICD-10-CM

## 2023-02-12 DIAGNOSIS — E1169 Type 2 diabetes mellitus with other specified complication: Secondary | ICD-10-CM | POA: Diagnosis not present

## 2023-02-12 DIAGNOSIS — H2513 Age-related nuclear cataract, bilateral: Secondary | ICD-10-CM | POA: Diagnosis not present

## 2023-02-12 DIAGNOSIS — E1159 Type 2 diabetes mellitus with other circulatory complications: Secondary | ICD-10-CM | POA: Diagnosis not present

## 2023-02-12 DIAGNOSIS — E118 Type 2 diabetes mellitus with unspecified complications: Secondary | ICD-10-CM | POA: Diagnosis not present

## 2023-02-12 DIAGNOSIS — E785 Hyperlipidemia, unspecified: Secondary | ICD-10-CM | POA: Diagnosis not present

## 2023-02-12 DIAGNOSIS — Z23 Encounter for immunization: Secondary | ICD-10-CM | POA: Diagnosis not present

## 2023-02-12 DIAGNOSIS — I152 Hypertension secondary to endocrine disorders: Secondary | ICD-10-CM

## 2023-02-12 DIAGNOSIS — Z794 Long term (current) use of insulin: Secondary | ICD-10-CM | POA: Diagnosis not present

## 2023-02-12 LAB — POCT GLYCOSYLATED HEMOGLOBIN (HGB A1C): Hemoglobin A1C: 6.5 % — AB (ref 4.0–5.6)

## 2023-02-12 MED ORDER — OLMESARTAN MEDOXOMIL-HCTZ 40-25 MG PO TABS
1.0000 | ORAL_TABLET | Freq: Every day | ORAL | 3 refills | Status: DC
  Filled 2023-02-12: qty 90, 90d supply, fill #0
  Filled 2023-05-19: qty 90, 90d supply, fill #1
  Filled 2023-08-26: qty 90, 90d supply, fill #2
  Filled 2023-11-18: qty 90, 90d supply, fill #3

## 2023-02-12 MED ORDER — SOLIQUA 100-33 UNT-MCG/ML ~~LOC~~ SOPN
30.0000 [IU] | PEN_INJECTOR | Freq: Every day | SUBCUTANEOUS | 5 refills | Status: DC
  Filled 2023-02-12: qty 9, 30d supply, fill #0

## 2023-02-12 MED ORDER — METFORMIN HCL 1000 MG PO TABS
1000.0000 mg | ORAL_TABLET | Freq: Two times a day (BID) | ORAL | 3 refills | Status: DC
  Filled 2023-02-12: qty 180, 90d supply, fill #0
  Filled 2023-05-19: qty 180, 90d supply, fill #1
  Filled 2023-08-26: qty 180, 90d supply, fill #2
  Filled 2023-11-18: qty 180, 90d supply, fill #3

## 2023-02-12 NOTE — Progress Notes (Signed)
Subjective:    Patient ID: Marcia Paul, female    DOB: Oct 15, 1945, 77 y.o.   MRN: [id number]  Marcia Paul is a 77 y.o. female who presents for follow-up of Type 2 diabetes mellitus.  Patient is checking home blood sugars.   Home blood sugar records:  BG average 125 over 90 days but 115 for the last 2 weeks.  How often is blood sugars being checked: continuous monitoring  Current symptoms/problems include hypoglycemia  and paresthesia of the feet and have been usually in the middle of night. Daily foot checks: yes   Any foot concerns: numbness and sharp pain occasionally. Goes away just as fast as it comes.  Last eye exam: May 2024 Exercise:  physical therapy   she does have therapist come to her house to help with exercises she is very excited about this.  She does use a CGM and seem to be paying attention to that.  Presently she is on Lantus as well as Janumet.  She is taking 60 units of Lantus at the present time.  Continues on olmesartan.    The following portions of the patient's history were reviewed and updated as appropriate: allergies, current medications, past medical history, past social history and problem list.  ROS as in subjective above.     Objective:    Physical Exam Alert and in no distress otherwise not examined. Hemoglobin A1c is 6.5.  I also reviewed the CGM report with her.  She does seem to do a very good job of keeping it in the good range but does have several times where she got low blood sugar alarms.  I went over this thoroughly with her. Blood pressure (!) 142/88, pulse (!) 52, height 5\' 10"  (1.778 m), weight (!) 411 lb 3.2 oz (186.5 kg), SpO2 95%.  Lab Review    Latest Ref Rng & Units 02/12/2023    4:43 PM 08/07/2022    4:36 PM 08/07/2022    4:06 PM 12/27/2021    3:20 PM 06/27/2021    4:09 PM  Diabetic Labs  HbA1c 4.0 - 5.6 % 6.5   7.1  7.1    Chol 100 - 199 mg/dL  952    841   HDL >32 mg/dL  73    64   Calc LDL 0 - 99 mg/dL  440    102    Triglycerides 0 - 149 mg/dL  85    725   Creatinine 0.57 - 1.00 mg/dL  3.66    4.40       34/74/2595    4:10 PM 01/27/2023   10:26 AM 08/07/2022    3:55 PM 08/07/2022    3:47 PM 02/11/2022    3:05 PM  BP/Weight  Systolic BP 142 -- 142 148   Diastolic BP 88 -- 80 90   Wt. (Lbs) 411.2 --  417.6 400.1  BMI 59 kg/m2   65.41 kg/m2 62.66 kg/m2      Latest Ref Rng & Units 07/03/2022   12:00 AM 06/20/2021   12:00 AM  Foot/eye exam completion dates  Eye Exam No Retinopathy No Retinopathy     No Retinopathy         This result is from an external source.    Banesa  reports that she quit smoking about 9 years ago. Her smoking use included cigarettes. She has never used smokeless tobacco. She reports current alcohol use. She reports that she does not use drugs.  Assessment & Plan:    Type 2 diabetes mellitus with complication, with long-term current use of insulin (HCC) - Plan: POCT glycosylated hemoglobin (Hb A1C)  Diabetic cataract, associated with type 2 diabetes mellitus (HCC)  Hyperlipidemia associated with type 2 diabetes mellitus (HCC)  Hypertension associated with diabetes (HCC) - Plan: olmesartan-hydrochlorothiazide (BENICAR HCT) 40-25 MG tablet  Morbid obesity, unspecified obesity type (HCC)  Nuclear sclerotic cataract of both eyes  Need for influenza vaccination - Plan: Flu Vaccine Trivalent High Dose (Fluad)  Need for COVID-19 vaccine - Plan: Pfizer Comirnaty Covid -19 Vaccine 65yrs and older Stop the Lantus and the Janumet and start taking metformin.  Monitor your sugars on the CGM and it will probably be elevated so lets work on eventually increasing the Soliqua by 2 units every 2 or 3 days to get that blood sugar down below 200 Gave yourself permission to take care of yourself he would be best if he could okay that is nice we are living in the past he would be he does he that is you uric it is me me me know do it.  Trying to think you have to take care of you got no one  you to take care but you are a lesser numbness.  You have no one to take care of but yourself so do it  Also discussed possible side effects of the GLP part of the [place].  She will keep me informed as to whether she has any trouble with GI problems.  Explained that I am more concerned about low blood sugars and elevated ones and we will continue to adjust the [facility] based on her blood sugar readings.

## 2023-02-12 NOTE — Patient Instructions (Addendum)
Stop the Lantus and the Janumet and start taking metformin.  Monitor your sugars on the CGM and it will probably be elevated so lets work on eventually increasing the Soliqua by 2 units every 2 or 3 days to get that blood sugar down below 200 Gave yourself permission to take care of yourself he would be best if he could okay that is nice we are living in the past he would be he does he that is you uric it is me me me know do it.  Trying to think you have to take care of you got no one you to take care but you are a lesser numbness.  You have no one to take care of but yourself so do it

## 2023-02-12 NOTE — Telephone Encounter (Signed)
error 

## 2023-02-13 ENCOUNTER — Other Ambulatory Visit (HOSPITAL_COMMUNITY): Payer: Self-pay

## 2023-02-13 ENCOUNTER — Telehealth: Payer: Self-pay

## 2023-02-13 ENCOUNTER — Telehealth: Payer: Self-pay | Admitting: Family Medicine

## 2023-02-13 DIAGNOSIS — M79662 Pain in left lower leg: Secondary | ICD-10-CM | POA: Diagnosis not present

## 2023-02-13 DIAGNOSIS — I10 Essential (primary) hypertension: Secondary | ICD-10-CM | POA: Diagnosis not present

## 2023-02-13 DIAGNOSIS — M25552 Pain in left hip: Secondary | ICD-10-CM | POA: Diagnosis not present

## 2023-02-13 DIAGNOSIS — D649 Anemia, unspecified: Secondary | ICD-10-CM | POA: Diagnosis not present

## 2023-02-13 DIAGNOSIS — E119 Type 2 diabetes mellitus without complications: Secondary | ICD-10-CM | POA: Diagnosis not present

## 2023-02-13 DIAGNOSIS — M79661 Pain in right lower leg: Secondary | ICD-10-CM | POA: Diagnosis not present

## 2023-02-13 DIAGNOSIS — M25551 Pain in right hip: Secondary | ICD-10-CM | POA: Diagnosis not present

## 2023-02-13 DIAGNOSIS — G8929 Other chronic pain: Secondary | ICD-10-CM | POA: Diagnosis not present

## 2023-02-13 DIAGNOSIS — K759 Inflammatory liver disease, unspecified: Secondary | ICD-10-CM | POA: Diagnosis not present

## 2023-02-13 NOTE — Telephone Encounter (Signed)
Pt wants freestyle libre sensors sent to [facility] not [facility]

## 2023-02-13 NOTE — Telephone Encounter (Signed)
Pharmacist called stating Soliqua 3 ml is only for 10 days & co pay is same for 10 days or 30, changed quantity to 9 ml for 30 day supply

## 2023-02-17 ENCOUNTER — Other Ambulatory Visit (HOSPITAL_COMMUNITY): Payer: Self-pay

## 2023-02-17 ENCOUNTER — Telehealth: Payer: Self-pay

## 2023-02-17 NOTE — Telephone Encounter (Signed)
Pt requesting a three month supply (or 75 days) instead of one month supply for insurance purposes

## 2023-02-18 ENCOUNTER — Other Ambulatory Visit (HOSPITAL_COMMUNITY): Payer: Self-pay

## 2023-02-18 MED ORDER — SOLIQUA 100-33 UNT-MCG/ML ~~LOC~~ SOPN
30.0000 [IU] | PEN_INJECTOR | Freq: Every day | SUBCUTANEOUS | 1 refills | Status: DC
  Filled 2023-02-18: qty 12, 40d supply, fill #0

## 2023-02-19 ENCOUNTER — Other Ambulatory Visit (HOSPITAL_COMMUNITY): Payer: Self-pay

## 2023-02-23 DIAGNOSIS — M25551 Pain in right hip: Secondary | ICD-10-CM | POA: Diagnosis not present

## 2023-02-23 DIAGNOSIS — E119 Type 2 diabetes mellitus without complications: Secondary | ICD-10-CM | POA: Diagnosis not present

## 2023-02-23 DIAGNOSIS — M25552 Pain in left hip: Secondary | ICD-10-CM | POA: Diagnosis not present

## 2023-02-23 DIAGNOSIS — K759 Inflammatory liver disease, unspecified: Secondary | ICD-10-CM | POA: Diagnosis not present

## 2023-02-23 DIAGNOSIS — M79661 Pain in right lower leg: Secondary | ICD-10-CM | POA: Diagnosis not present

## 2023-02-23 DIAGNOSIS — M79662 Pain in left lower leg: Secondary | ICD-10-CM | POA: Diagnosis not present

## 2023-02-23 DIAGNOSIS — D649 Anemia, unspecified: Secondary | ICD-10-CM | POA: Diagnosis not present

## 2023-02-23 DIAGNOSIS — I10 Essential (primary) hypertension: Secondary | ICD-10-CM | POA: Diagnosis not present

## 2023-02-23 DIAGNOSIS — G8929 Other chronic pain: Secondary | ICD-10-CM | POA: Diagnosis not present

## 2023-02-25 ENCOUNTER — Other Ambulatory Visit: Payer: Self-pay | Admitting: Family Medicine

## 2023-02-25 DIAGNOSIS — E118 Type 2 diabetes mellitus with unspecified complications: Secondary | ICD-10-CM

## 2023-03-02 ENCOUNTER — Other Ambulatory Visit (HOSPITAL_COMMUNITY): Payer: Self-pay

## 2023-03-02 ENCOUNTER — Other Ambulatory Visit: Payer: Self-pay | Admitting: Family Medicine

## 2023-03-02 ENCOUNTER — Other Ambulatory Visit: Payer: Self-pay | Admitting: *Deleted

## 2023-03-02 ENCOUNTER — Other Ambulatory Visit: Payer: Self-pay

## 2023-03-02 MED ORDER — SOLIQUA 100-33 UNT-MCG/ML ~~LOC~~ SOPN
30.0000 [IU] | PEN_INJECTOR | Freq: Every day | SUBCUTANEOUS | 1 refills | Status: DC
  Filled 2023-03-02 – 2023-03-10 (×2): qty 27, 90d supply, fill #0

## 2023-03-06 ENCOUNTER — Other Ambulatory Visit: Payer: Self-pay | Admitting: Family Medicine

## 2023-03-10 ENCOUNTER — Other Ambulatory Visit (HOSPITAL_COMMUNITY): Payer: Self-pay

## 2023-03-10 ENCOUNTER — Other Ambulatory Visit: Payer: Self-pay

## 2023-03-11 ENCOUNTER — Other Ambulatory Visit: Payer: Self-pay

## 2023-03-11 ENCOUNTER — Other Ambulatory Visit (HOSPITAL_COMMUNITY): Payer: Self-pay

## 2023-03-14 DIAGNOSIS — H2513 Age-related nuclear cataract, bilateral: Secondary | ICD-10-CM | POA: Diagnosis not present

## 2023-03-14 DIAGNOSIS — D649 Anemia, unspecified: Secondary | ICD-10-CM

## 2023-03-14 DIAGNOSIS — E785 Hyperlipidemia, unspecified: Secondary | ICD-10-CM | POA: Diagnosis not present

## 2023-03-14 DIAGNOSIS — E1169 Type 2 diabetes mellitus with other specified complication: Secondary | ICD-10-CM | POA: Diagnosis not present

## 2023-03-14 DIAGNOSIS — K5900 Constipation, unspecified: Secondary | ICD-10-CM

## 2023-03-14 DIAGNOSIS — E1159 Type 2 diabetes mellitus with other circulatory complications: Secondary | ICD-10-CM | POA: Diagnosis not present

## 2023-03-14 DIAGNOSIS — G729 Myopathy, unspecified: Secondary | ICD-10-CM | POA: Diagnosis not present

## 2023-03-14 DIAGNOSIS — G8929 Other chronic pain: Secondary | ICD-10-CM | POA: Diagnosis not present

## 2023-03-14 DIAGNOSIS — R202 Paresthesia of skin: Secondary | ICD-10-CM

## 2023-03-14 DIAGNOSIS — I152 Hypertension secondary to endocrine disorders: Secondary | ICD-10-CM | POA: Diagnosis not present

## 2023-03-14 DIAGNOSIS — E1136 Type 2 diabetes mellitus with diabetic cataract: Secondary | ICD-10-CM | POA: Diagnosis not present

## 2023-03-14 DIAGNOSIS — M549 Dorsalgia, unspecified: Secondary | ICD-10-CM | POA: Diagnosis not present

## 2023-03-20 ENCOUNTER — Telehealth: Payer: Self-pay | Admitting: Family Medicine

## 2023-03-20 NOTE — Telephone Encounter (Signed)
Pt called you had referred her to PT and she said it has really helped with her mobility and is wanting to continue. Apparently there is some issue with insurance and I have left message with [facility] To call and let me know what that is. If we can get that resolved, are you ok with her continuing therapy?   Should I route this back to Dr Susann Givens since he is pcp?

## 2023-03-20 NOTE — Telephone Encounter (Signed)
I spoke to [facility] and insurance is denying extending additional Home Health PT and they do not give a reason,  She said it is not worth do appeal She to wait 1 month and submit new order  Patient's last pt ended 12/23 so we can reorder after 03/26/23

## 2023-03-24 ENCOUNTER — Other Ambulatory Visit (HOSPITAL_COMMUNITY): Payer: Self-pay

## 2023-04-01 NOTE — Telephone Encounter (Signed)
Pt can now have new order for home PT Per [facility] she could have new order after 1/23

## 2023-04-03 ENCOUNTER — Other Ambulatory Visit: Payer: Self-pay

## 2023-04-03 DIAGNOSIS — Z9989 Dependence on other enabling machines and devices: Secondary | ICD-10-CM

## 2023-04-03 DIAGNOSIS — R531 Weakness: Secondary | ICD-10-CM

## 2023-04-03 DIAGNOSIS — R2689 Other abnormalities of gait and mobility: Secondary | ICD-10-CM

## 2023-04-03 DIAGNOSIS — M25551 Pain in right hip: Secondary | ICD-10-CM

## 2023-04-09 ENCOUNTER — Other Ambulatory Visit (HOSPITAL_COMMUNITY): Payer: Self-pay

## 2023-04-09 DIAGNOSIS — E1169 Type 2 diabetes mellitus with other specified complication: Secondary | ICD-10-CM | POA: Diagnosis not present

## 2023-04-09 DIAGNOSIS — E1136 Type 2 diabetes mellitus with diabetic cataract: Secondary | ICD-10-CM | POA: Diagnosis not present

## 2023-04-09 DIAGNOSIS — G729 Myopathy, unspecified: Secondary | ICD-10-CM | POA: Diagnosis not present

## 2023-04-09 DIAGNOSIS — E785 Hyperlipidemia, unspecified: Secondary | ICD-10-CM | POA: Diagnosis not present

## 2023-04-09 DIAGNOSIS — I152 Hypertension secondary to endocrine disorders: Secondary | ICD-10-CM | POA: Diagnosis not present

## 2023-04-09 DIAGNOSIS — G8929 Other chronic pain: Secondary | ICD-10-CM | POA: Diagnosis not present

## 2023-04-09 DIAGNOSIS — E1159 Type 2 diabetes mellitus with other circulatory complications: Secondary | ICD-10-CM | POA: Diagnosis not present

## 2023-04-09 DIAGNOSIS — H2513 Age-related nuclear cataract, bilateral: Secondary | ICD-10-CM | POA: Diagnosis not present

## 2023-04-09 DIAGNOSIS — M549 Dorsalgia, unspecified: Secondary | ICD-10-CM | POA: Diagnosis not present

## 2023-04-10 ENCOUNTER — Telehealth: Payer: Self-pay | Admitting: Family Medicine

## 2023-04-10 DIAGNOSIS — H2513 Age-related nuclear cataract, bilateral: Secondary | ICD-10-CM | POA: Diagnosis not present

## 2023-04-10 DIAGNOSIS — E1136 Type 2 diabetes mellitus with diabetic cataract: Secondary | ICD-10-CM | POA: Diagnosis not present

## 2023-04-10 DIAGNOSIS — I152 Hypertension secondary to endocrine disorders: Secondary | ICD-10-CM | POA: Diagnosis not present

## 2023-04-10 DIAGNOSIS — M549 Dorsalgia, unspecified: Secondary | ICD-10-CM | POA: Diagnosis not present

## 2023-04-10 DIAGNOSIS — E785 Hyperlipidemia, unspecified: Secondary | ICD-10-CM | POA: Diagnosis not present

## 2023-04-10 DIAGNOSIS — E1159 Type 2 diabetes mellitus with other circulatory complications: Secondary | ICD-10-CM | POA: Diagnosis not present

## 2023-04-10 DIAGNOSIS — E1169 Type 2 diabetes mellitus with other specified complication: Secondary | ICD-10-CM | POA: Diagnosis not present

## 2023-04-10 DIAGNOSIS — G8929 Other chronic pain: Secondary | ICD-10-CM | POA: Diagnosis not present

## 2023-04-10 DIAGNOSIS — G729 Myopathy, unspecified: Secondary | ICD-10-CM | POA: Diagnosis not present

## 2023-04-10 NOTE — Telephone Encounter (Signed)
 Nate with [facility] [phone]   PT 2 times week for 6 weeks gait, strength and balance and safety in the home  [facility] for PT

## 2023-04-13 ENCOUNTER — Telehealth: Payer: Self-pay | Admitting: Family Medicine

## 2023-04-13 ENCOUNTER — Other Ambulatory Visit (HOSPITAL_COMMUNITY): Payer: Self-pay

## 2023-04-13 ENCOUNTER — Other Ambulatory Visit: Payer: Self-pay

## 2023-04-13 MED ORDER — ACCU-CHEK SOFTCLIX LANCETS MISC
1.0000 | Freq: Three times a day (TID) | 1 refills | Status: AC
  Filled 2023-04-13: qty 300, 90d supply, fill #0

## 2023-04-13 NOTE — Telephone Encounter (Signed)
 Pt needs refill Accu check softclix lancets 3 mo supply   Marcia Paul

## 2023-04-17 ENCOUNTER — Other Ambulatory Visit (HOSPITAL_COMMUNITY): Payer: Self-pay

## 2023-04-17 DIAGNOSIS — E1136 Type 2 diabetes mellitus with diabetic cataract: Secondary | ICD-10-CM | POA: Diagnosis not present

## 2023-04-17 DIAGNOSIS — E1169 Type 2 diabetes mellitus with other specified complication: Secondary | ICD-10-CM | POA: Diagnosis not present

## 2023-04-17 DIAGNOSIS — H2513 Age-related nuclear cataract, bilateral: Secondary | ICD-10-CM | POA: Diagnosis not present

## 2023-04-17 DIAGNOSIS — E1159 Type 2 diabetes mellitus with other circulatory complications: Secondary | ICD-10-CM | POA: Diagnosis not present

## 2023-04-17 DIAGNOSIS — G729 Myopathy, unspecified: Secondary | ICD-10-CM | POA: Diagnosis not present

## 2023-04-17 DIAGNOSIS — E785 Hyperlipidemia, unspecified: Secondary | ICD-10-CM | POA: Diagnosis not present

## 2023-04-17 DIAGNOSIS — I152 Hypertension secondary to endocrine disorders: Secondary | ICD-10-CM | POA: Diagnosis not present

## 2023-04-17 DIAGNOSIS — G8929 Other chronic pain: Secondary | ICD-10-CM | POA: Diagnosis not present

## 2023-04-17 DIAGNOSIS — M549 Dorsalgia, unspecified: Secondary | ICD-10-CM | POA: Diagnosis not present

## 2023-04-18 ENCOUNTER — Other Ambulatory Visit (HOSPITAL_COMMUNITY): Payer: Self-pay

## 2023-04-20 DIAGNOSIS — Z794 Long term (current) use of insulin: Secondary | ICD-10-CM | POA: Diagnosis not present

## 2023-04-20 DIAGNOSIS — E118 Type 2 diabetes mellitus with unspecified complications: Secondary | ICD-10-CM | POA: Diagnosis not present

## 2023-04-23 ENCOUNTER — Ambulatory Visit: Payer: Medicare PPO | Admitting: Family Medicine

## 2023-04-28 ENCOUNTER — Encounter: Payer: Self-pay | Admitting: Internal Medicine

## 2023-04-29 DIAGNOSIS — H2513 Age-related nuclear cataract, bilateral: Secondary | ICD-10-CM | POA: Diagnosis not present

## 2023-04-29 DIAGNOSIS — E1159 Type 2 diabetes mellitus with other circulatory complications: Secondary | ICD-10-CM | POA: Diagnosis not present

## 2023-04-29 DIAGNOSIS — E1169 Type 2 diabetes mellitus with other specified complication: Secondary | ICD-10-CM | POA: Diagnosis not present

## 2023-04-29 DIAGNOSIS — E1136 Type 2 diabetes mellitus with diabetic cataract: Secondary | ICD-10-CM | POA: Diagnosis not present

## 2023-04-29 DIAGNOSIS — I152 Hypertension secondary to endocrine disorders: Secondary | ICD-10-CM | POA: Diagnosis not present

## 2023-04-29 DIAGNOSIS — M549 Dorsalgia, unspecified: Secondary | ICD-10-CM | POA: Diagnosis not present

## 2023-04-29 DIAGNOSIS — E785 Hyperlipidemia, unspecified: Secondary | ICD-10-CM | POA: Diagnosis not present

## 2023-04-29 DIAGNOSIS — G729 Myopathy, unspecified: Secondary | ICD-10-CM | POA: Diagnosis not present

## 2023-04-29 DIAGNOSIS — G8929 Other chronic pain: Secondary | ICD-10-CM | POA: Diagnosis not present

## 2023-05-01 DIAGNOSIS — H2513 Age-related nuclear cataract, bilateral: Secondary | ICD-10-CM | POA: Diagnosis not present

## 2023-05-01 DIAGNOSIS — E1169 Type 2 diabetes mellitus with other specified complication: Secondary | ICD-10-CM | POA: Diagnosis not present

## 2023-05-01 DIAGNOSIS — E1136 Type 2 diabetes mellitus with diabetic cataract: Secondary | ICD-10-CM | POA: Diagnosis not present

## 2023-05-01 DIAGNOSIS — E1159 Type 2 diabetes mellitus with other circulatory complications: Secondary | ICD-10-CM | POA: Diagnosis not present

## 2023-05-01 DIAGNOSIS — M549 Dorsalgia, unspecified: Secondary | ICD-10-CM | POA: Diagnosis not present

## 2023-05-01 DIAGNOSIS — G8929 Other chronic pain: Secondary | ICD-10-CM | POA: Diagnosis not present

## 2023-05-01 DIAGNOSIS — G729 Myopathy, unspecified: Secondary | ICD-10-CM | POA: Diagnosis not present

## 2023-05-01 DIAGNOSIS — E785 Hyperlipidemia, unspecified: Secondary | ICD-10-CM | POA: Diagnosis not present

## 2023-05-01 DIAGNOSIS — I152 Hypertension secondary to endocrine disorders: Secondary | ICD-10-CM | POA: Diagnosis not present

## 2023-05-04 DIAGNOSIS — E1159 Type 2 diabetes mellitus with other circulatory complications: Secondary | ICD-10-CM | POA: Diagnosis not present

## 2023-05-04 DIAGNOSIS — I152 Hypertension secondary to endocrine disorders: Secondary | ICD-10-CM | POA: Diagnosis not present

## 2023-05-04 DIAGNOSIS — H2513 Age-related nuclear cataract, bilateral: Secondary | ICD-10-CM | POA: Diagnosis not present

## 2023-05-04 DIAGNOSIS — G8929 Other chronic pain: Secondary | ICD-10-CM | POA: Diagnosis not present

## 2023-05-04 DIAGNOSIS — E785 Hyperlipidemia, unspecified: Secondary | ICD-10-CM | POA: Diagnosis not present

## 2023-05-04 DIAGNOSIS — G729 Myopathy, unspecified: Secondary | ICD-10-CM | POA: Diagnosis not present

## 2023-05-04 DIAGNOSIS — M549 Dorsalgia, unspecified: Secondary | ICD-10-CM | POA: Diagnosis not present

## 2023-05-04 DIAGNOSIS — E1136 Type 2 diabetes mellitus with diabetic cataract: Secondary | ICD-10-CM | POA: Diagnosis not present

## 2023-05-04 DIAGNOSIS — E1169 Type 2 diabetes mellitus with other specified complication: Secondary | ICD-10-CM | POA: Diagnosis not present

## 2023-05-07 DIAGNOSIS — E1159 Type 2 diabetes mellitus with other circulatory complications: Secondary | ICD-10-CM | POA: Diagnosis not present

## 2023-05-07 DIAGNOSIS — E1136 Type 2 diabetes mellitus with diabetic cataract: Secondary | ICD-10-CM | POA: Diagnosis not present

## 2023-05-07 DIAGNOSIS — E1169 Type 2 diabetes mellitus with other specified complication: Secondary | ICD-10-CM | POA: Diagnosis not present

## 2023-05-07 DIAGNOSIS — I152 Hypertension secondary to endocrine disorders: Secondary | ICD-10-CM | POA: Diagnosis not present

## 2023-05-07 DIAGNOSIS — E785 Hyperlipidemia, unspecified: Secondary | ICD-10-CM | POA: Diagnosis not present

## 2023-05-07 DIAGNOSIS — G8929 Other chronic pain: Secondary | ICD-10-CM | POA: Diagnosis not present

## 2023-05-07 DIAGNOSIS — G729 Myopathy, unspecified: Secondary | ICD-10-CM | POA: Diagnosis not present

## 2023-05-07 DIAGNOSIS — M549 Dorsalgia, unspecified: Secondary | ICD-10-CM | POA: Diagnosis not present

## 2023-05-07 DIAGNOSIS — H2513 Age-related nuclear cataract, bilateral: Secondary | ICD-10-CM | POA: Diagnosis not present

## 2023-05-13 DIAGNOSIS — E1159 Type 2 diabetes mellitus with other circulatory complications: Secondary | ICD-10-CM | POA: Diagnosis not present

## 2023-05-13 DIAGNOSIS — G729 Myopathy, unspecified: Secondary | ICD-10-CM | POA: Diagnosis not present

## 2023-05-13 DIAGNOSIS — M549 Dorsalgia, unspecified: Secondary | ICD-10-CM | POA: Diagnosis not present

## 2023-05-13 DIAGNOSIS — I152 Hypertension secondary to endocrine disorders: Secondary | ICD-10-CM | POA: Diagnosis not present

## 2023-05-13 DIAGNOSIS — H2513 Age-related nuclear cataract, bilateral: Secondary | ICD-10-CM | POA: Diagnosis not present

## 2023-05-13 DIAGNOSIS — E1136 Type 2 diabetes mellitus with diabetic cataract: Secondary | ICD-10-CM | POA: Diagnosis not present

## 2023-05-13 DIAGNOSIS — E1169 Type 2 diabetes mellitus with other specified complication: Secondary | ICD-10-CM | POA: Diagnosis not present

## 2023-05-13 DIAGNOSIS — G8929 Other chronic pain: Secondary | ICD-10-CM | POA: Diagnosis not present

## 2023-05-13 DIAGNOSIS — E785 Hyperlipidemia, unspecified: Secondary | ICD-10-CM | POA: Diagnosis not present

## 2023-05-14 ENCOUNTER — Ambulatory Visit: Payer: Medicare PPO | Admitting: Family Medicine

## 2023-05-14 VITALS — BP 126/82 | HR 68 | Wt 397.0 lb

## 2023-05-14 DIAGNOSIS — Z794 Long term (current) use of insulin: Secondary | ICD-10-CM

## 2023-05-14 DIAGNOSIS — E118 Type 2 diabetes mellitus with unspecified complications: Secondary | ICD-10-CM

## 2023-05-14 NOTE — Progress Notes (Signed)
   Subjective:    Patient ID: Marcia Paul, female    DOB: 08-26-1945, 78 y.o.   MRN: [id number]  HPI She is here for a recheck.  She is now taking 36 units of Soliqua daily.  She is getting physical therapy twice per week at home and is also doing it on her own.  She is making some progress.  I did review the AGP report with her.   Review of Systems     Objective:    Physical Exam Alert and in no distress otherwise not examined.  Her weight is down roughly 11 pounds.       Assessment & Plan:  Type 2 diabetes mellitus with complication, with long-term current use of insulin (HCC)  Morbid obesity, unspecified obesity type (HCC) I reviewed the AGP report with her.  She has kept her blood sugar in very good range with only a couple of instances where it goes above the upper limit of normal.  She states that she really does not eat much.  The GMI indicator shows 6.6.  I congratulated her on the work that she has done. I will check her back here in around 3 months.

## 2023-05-18 ENCOUNTER — Telehealth: Payer: Self-pay | Admitting: Internal Medicine

## 2023-05-18 ENCOUNTER — Other Ambulatory Visit: Payer: Self-pay | Admitting: Medical

## 2023-05-18 ENCOUNTER — Other Ambulatory Visit (HOSPITAL_COMMUNITY): Payer: Self-pay

## 2023-05-18 DIAGNOSIS — E1159 Type 2 diabetes mellitus with other circulatory complications: Secondary | ICD-10-CM | POA: Diagnosis not present

## 2023-05-18 DIAGNOSIS — G729 Myopathy, unspecified: Secondary | ICD-10-CM | POA: Diagnosis not present

## 2023-05-18 DIAGNOSIS — I152 Hypertension secondary to endocrine disorders: Secondary | ICD-10-CM | POA: Diagnosis not present

## 2023-05-18 DIAGNOSIS — E785 Hyperlipidemia, unspecified: Secondary | ICD-10-CM | POA: Diagnosis not present

## 2023-05-18 DIAGNOSIS — G8929 Other chronic pain: Secondary | ICD-10-CM | POA: Diagnosis not present

## 2023-05-18 DIAGNOSIS — E1169 Type 2 diabetes mellitus with other specified complication: Secondary | ICD-10-CM | POA: Diagnosis not present

## 2023-05-18 DIAGNOSIS — M549 Dorsalgia, unspecified: Secondary | ICD-10-CM | POA: Diagnosis not present

## 2023-05-18 DIAGNOSIS — H2513 Age-related nuclear cataract, bilateral: Secondary | ICD-10-CM | POA: Diagnosis not present

## 2023-05-18 DIAGNOSIS — E1136 Type 2 diabetes mellitus with diabetic cataract: Secondary | ICD-10-CM | POA: Diagnosis not present

## 2023-05-18 MED ORDER — SOLIQUA 100-33 UNT-MCG/ML ~~LOC~~ SOPN
30.0000 [IU] | PEN_INJECTOR | Freq: Every day | SUBCUTANEOUS | 2 refills | Status: DC
  Filled 2023-05-18 (×2): qty 27, 90d supply, fill #0

## 2023-05-18 NOTE — Telephone Encounter (Signed)
 Copied from CRM [id number]. Topic: Clinical - Medication Question >> May 18, 2023 11:54 AM Antwanette L wrote: Reason for CRM: Patient is requesting a call back from [place]. Patient said she spoke with Vincenza Hews about Insulin Glargine-Lixisenatide (SOLIQUA) 100-33 UNT-MCG/ML SOPN. The patient is on her last shot and is requesting a refill on the medication. The patient wants to know if the order can be a 36 daily unit and a 90 day supply. The patient can be contacted by phone at [phone].  Preferred Pharmacy [facility]  Phone: [phone] Fax: [phone]

## 2023-05-18 NOTE — Telephone Encounter (Signed)
 done

## 2023-05-19 ENCOUNTER — Other Ambulatory Visit: Payer: Self-pay | Admitting: Medical

## 2023-05-19 ENCOUNTER — Other Ambulatory Visit (HOSPITAL_COMMUNITY): Payer: Self-pay

## 2023-05-19 ENCOUNTER — Telehealth: Payer: Self-pay | Admitting: Family Medicine

## 2023-05-19 MED ORDER — SOLIQUA 100-33 UNT-MCG/ML ~~LOC~~ SOPN
36.0000 [IU] | PEN_INJECTOR | Freq: Every day | SUBCUTANEOUS | 2 refills | Status: DC
  Filled 2023-05-19: qty 27, fill #0

## 2023-05-19 MED ORDER — SOLIQUA 100-33 UNT-MCG/ML ~~LOC~~ SOPN
36.0000 [IU] | PEN_INJECTOR | Freq: Every day | SUBCUTANEOUS | 1 refills | Status: DC
  Filled 2023-05-19: qty 30, 84d supply, fill #0
  Filled 2023-08-26: qty 30, 84d supply, fill #1

## 2023-05-19 NOTE — Addendum Note (Signed)
 Addended by: Herminio Commons A on: 05/19/2023 11:16 AM   Modules accepted: Orders

## 2023-05-19 NOTE — Telephone Encounter (Signed)
 Pt called she was told to increase her Soliqua by 2 units per day until her blood sugar was under 200.  She is needing 36 units per day sent to [facility].  Spoke with Vincenza Hews and he corrected to 36 units. Pt advised.

## 2023-05-19 NOTE — Telephone Encounter (Signed)
 I have refilled this.

## 2023-05-20 DIAGNOSIS — E1169 Type 2 diabetes mellitus with other specified complication: Secondary | ICD-10-CM | POA: Diagnosis not present

## 2023-05-20 DIAGNOSIS — G8929 Other chronic pain: Secondary | ICD-10-CM | POA: Diagnosis not present

## 2023-05-20 DIAGNOSIS — E1159 Type 2 diabetes mellitus with other circulatory complications: Secondary | ICD-10-CM | POA: Diagnosis not present

## 2023-05-20 DIAGNOSIS — I152 Hypertension secondary to endocrine disorders: Secondary | ICD-10-CM | POA: Diagnosis not present

## 2023-05-20 DIAGNOSIS — G729 Myopathy, unspecified: Secondary | ICD-10-CM | POA: Diagnosis not present

## 2023-05-20 DIAGNOSIS — H2513 Age-related nuclear cataract, bilateral: Secondary | ICD-10-CM | POA: Diagnosis not present

## 2023-05-20 DIAGNOSIS — E1136 Type 2 diabetes mellitus with diabetic cataract: Secondary | ICD-10-CM | POA: Diagnosis not present

## 2023-05-20 DIAGNOSIS — M549 Dorsalgia, unspecified: Secondary | ICD-10-CM | POA: Diagnosis not present

## 2023-05-20 DIAGNOSIS — E785 Hyperlipidemia, unspecified: Secondary | ICD-10-CM | POA: Diagnosis not present

## 2023-05-25 DIAGNOSIS — G8929 Other chronic pain: Secondary | ICD-10-CM | POA: Diagnosis not present

## 2023-05-25 DIAGNOSIS — E1169 Type 2 diabetes mellitus with other specified complication: Secondary | ICD-10-CM | POA: Diagnosis not present

## 2023-05-25 DIAGNOSIS — H2513 Age-related nuclear cataract, bilateral: Secondary | ICD-10-CM | POA: Diagnosis not present

## 2023-05-25 DIAGNOSIS — E1159 Type 2 diabetes mellitus with other circulatory complications: Secondary | ICD-10-CM | POA: Diagnosis not present

## 2023-05-25 DIAGNOSIS — G729 Myopathy, unspecified: Secondary | ICD-10-CM | POA: Diagnosis not present

## 2023-05-25 DIAGNOSIS — I152 Hypertension secondary to endocrine disorders: Secondary | ICD-10-CM | POA: Diagnosis not present

## 2023-05-25 DIAGNOSIS — M549 Dorsalgia, unspecified: Secondary | ICD-10-CM | POA: Diagnosis not present

## 2023-05-25 DIAGNOSIS — E1136 Type 2 diabetes mellitus with diabetic cataract: Secondary | ICD-10-CM | POA: Diagnosis not present

## 2023-05-25 DIAGNOSIS — E785 Hyperlipidemia, unspecified: Secondary | ICD-10-CM | POA: Diagnosis not present

## 2023-05-27 DIAGNOSIS — I152 Hypertension secondary to endocrine disorders: Secondary | ICD-10-CM | POA: Diagnosis not present

## 2023-05-27 DIAGNOSIS — E1136 Type 2 diabetes mellitus with diabetic cataract: Secondary | ICD-10-CM | POA: Diagnosis not present

## 2023-05-27 DIAGNOSIS — E1159 Type 2 diabetes mellitus with other circulatory complications: Secondary | ICD-10-CM | POA: Diagnosis not present

## 2023-05-27 DIAGNOSIS — M549 Dorsalgia, unspecified: Secondary | ICD-10-CM | POA: Diagnosis not present

## 2023-05-27 DIAGNOSIS — G729 Myopathy, unspecified: Secondary | ICD-10-CM | POA: Diagnosis not present

## 2023-05-27 DIAGNOSIS — E785 Hyperlipidemia, unspecified: Secondary | ICD-10-CM | POA: Diagnosis not present

## 2023-05-27 DIAGNOSIS — G8929 Other chronic pain: Secondary | ICD-10-CM | POA: Diagnosis not present

## 2023-05-27 DIAGNOSIS — H2513 Age-related nuclear cataract, bilateral: Secondary | ICD-10-CM | POA: Diagnosis not present

## 2023-05-27 DIAGNOSIS — E1169 Type 2 diabetes mellitus with other specified complication: Secondary | ICD-10-CM | POA: Diagnosis not present

## 2023-06-02 DIAGNOSIS — I152 Hypertension secondary to endocrine disorders: Secondary | ICD-10-CM | POA: Diagnosis not present

## 2023-06-02 DIAGNOSIS — G729 Myopathy, unspecified: Secondary | ICD-10-CM | POA: Diagnosis not present

## 2023-06-02 DIAGNOSIS — G8929 Other chronic pain: Secondary | ICD-10-CM | POA: Diagnosis not present

## 2023-06-02 DIAGNOSIS — E1136 Type 2 diabetes mellitus with diabetic cataract: Secondary | ICD-10-CM | POA: Diagnosis not present

## 2023-06-02 DIAGNOSIS — H2513 Age-related nuclear cataract, bilateral: Secondary | ICD-10-CM | POA: Diagnosis not present

## 2023-06-02 DIAGNOSIS — M549 Dorsalgia, unspecified: Secondary | ICD-10-CM | POA: Diagnosis not present

## 2023-06-02 DIAGNOSIS — E1169 Type 2 diabetes mellitus with other specified complication: Secondary | ICD-10-CM | POA: Diagnosis not present

## 2023-06-02 DIAGNOSIS — E785 Hyperlipidemia, unspecified: Secondary | ICD-10-CM | POA: Diagnosis not present

## 2023-06-02 DIAGNOSIS — E1159 Type 2 diabetes mellitus with other circulatory complications: Secondary | ICD-10-CM | POA: Diagnosis not present

## 2023-06-03 DIAGNOSIS — G729 Myopathy, unspecified: Secondary | ICD-10-CM | POA: Diagnosis not present

## 2023-06-03 DIAGNOSIS — E1169 Type 2 diabetes mellitus with other specified complication: Secondary | ICD-10-CM | POA: Diagnosis not present

## 2023-06-03 DIAGNOSIS — H2513 Age-related nuclear cataract, bilateral: Secondary | ICD-10-CM | POA: Diagnosis not present

## 2023-06-03 DIAGNOSIS — G8929 Other chronic pain: Secondary | ICD-10-CM | POA: Diagnosis not present

## 2023-06-03 DIAGNOSIS — E785 Hyperlipidemia, unspecified: Secondary | ICD-10-CM | POA: Diagnosis not present

## 2023-06-03 DIAGNOSIS — E1136 Type 2 diabetes mellitus with diabetic cataract: Secondary | ICD-10-CM | POA: Diagnosis not present

## 2023-06-03 DIAGNOSIS — E1159 Type 2 diabetes mellitus with other circulatory complications: Secondary | ICD-10-CM | POA: Diagnosis not present

## 2023-06-03 DIAGNOSIS — M549 Dorsalgia, unspecified: Secondary | ICD-10-CM | POA: Diagnosis not present

## 2023-06-03 DIAGNOSIS — I152 Hypertension secondary to endocrine disorders: Secondary | ICD-10-CM | POA: Diagnosis not present

## 2023-06-05 ENCOUNTER — Telehealth: Payer: Self-pay | Admitting: Internal Medicine

## 2023-06-05 NOTE — Telephone Encounter (Signed)
 [employer] has requested peer to peer to determine home health services  Per Dr. Susann Givens, pt is not home bound and can move and can do outpatient therapy for PT versus home health nursing services.   Called patient but she declines outpatient PT for help as she doesn't drive and doesn't want to ask her daughter for help. She will try to work on home exercises that she was given before when PT worked with her but she is scared of falling as no one is there to help her. She said she will figure it out.

## 2023-06-09 DIAGNOSIS — R202 Paresthesia of skin: Secondary | ICD-10-CM

## 2023-06-09 DIAGNOSIS — G8929 Other chronic pain: Secondary | ICD-10-CM | POA: Diagnosis not present

## 2023-06-09 DIAGNOSIS — E1136 Type 2 diabetes mellitus with diabetic cataract: Secondary | ICD-10-CM | POA: Diagnosis not present

## 2023-06-09 DIAGNOSIS — E1159 Type 2 diabetes mellitus with other circulatory complications: Secondary | ICD-10-CM | POA: Diagnosis not present

## 2023-06-09 DIAGNOSIS — M549 Dorsalgia, unspecified: Secondary | ICD-10-CM | POA: Diagnosis not present

## 2023-06-09 DIAGNOSIS — E785 Hyperlipidemia, unspecified: Secondary | ICD-10-CM | POA: Diagnosis not present

## 2023-06-09 DIAGNOSIS — K5900 Constipation, unspecified: Secondary | ICD-10-CM

## 2023-06-09 DIAGNOSIS — E1169 Type 2 diabetes mellitus with other specified complication: Secondary | ICD-10-CM | POA: Diagnosis not present

## 2023-06-09 DIAGNOSIS — I152 Hypertension secondary to endocrine disorders: Secondary | ICD-10-CM | POA: Diagnosis not present

## 2023-06-09 DIAGNOSIS — G729 Myopathy, unspecified: Secondary | ICD-10-CM | POA: Diagnosis not present

## 2023-06-09 DIAGNOSIS — H2513 Age-related nuclear cataract, bilateral: Secondary | ICD-10-CM | POA: Diagnosis not present

## 2023-06-09 DIAGNOSIS — D649 Anemia, unspecified: Secondary | ICD-10-CM

## 2023-07-14 ENCOUNTER — Telehealth: Payer: Self-pay

## 2023-07-14 NOTE — Telephone Encounter (Signed)
 Pt. Requesting new CGM per pharmacy [place] 2 is discontinued.   Copied from CRM [id number]. Topic: Clinical - Prescription Issue >> Jul 14, 2023 12:24 PM Zipporah Him wrote: Patient she states this system she is using now for monitoring blood sugar (libre freestyle 2) os discontinued (per pharmacy) she would like to get whatever device is available with her insurance.   She would like a new device sent to:  [facility] [address] [place] [address] Phone: [phone] Fax: [phone]  Please call patient and advise on what her options are

## 2023-07-16 ENCOUNTER — Telehealth: Admitting: Family Medicine

## 2023-07-16 MED ORDER — FREESTYLE LIBRE 2 PLUS SENSOR MISC
1.0000 | 1 refills | Status: DC
  Filled 2023-07-16: qty 6, 84d supply, fill #0
  Filled 2023-10-15: qty 6, 84d supply, fill #1

## 2023-07-16 NOTE — Telephone Encounter (Signed)
 [facility] this is not our patient

## 2023-07-16 NOTE — Telephone Encounter (Signed)
 Libre 2 is being replaced with libre 2 plus sensor. I have sent libre 2 plus in to pharmacy.

## 2023-07-16 NOTE — Telephone Encounter (Signed)
 Copied from CRM [id number]. Topic: Clinical - Medication Question >> Jul 16, 2023 10:01 AM Oddis Bench wrote: Reason for CRM: Patient is calling about the Continuous Blood Gluc Sensor (FREESTYLE LIBRE 2 SENSOR) MISC, per the chart notes the Dr. Has rcvd the request and stated the ok, she is now requesting for it to be a 90 day supply.

## 2023-07-17 ENCOUNTER — Other Ambulatory Visit (HOSPITAL_COMMUNITY): Payer: Self-pay

## 2023-07-17 NOTE — Telephone Encounter (Signed)
Sent this in yesterday

## 2023-07-18 ENCOUNTER — Other Ambulatory Visit (HOSPITAL_COMMUNITY): Payer: Self-pay

## 2023-07-29 ENCOUNTER — Other Ambulatory Visit (HOSPITAL_COMMUNITY): Payer: Self-pay

## 2023-08-20 ENCOUNTER — Encounter: Payer: Self-pay | Admitting: Family Medicine

## 2023-08-20 ENCOUNTER — Ambulatory Visit: Admitting: Family Medicine

## 2023-08-20 VITALS — BP 122/80 | HR 84 | Wt >= 6400 oz

## 2023-08-20 DIAGNOSIS — E1169 Type 2 diabetes mellitus with other specified complication: Secondary | ICD-10-CM

## 2023-08-20 DIAGNOSIS — Z794 Long term (current) use of insulin: Secondary | ICD-10-CM

## 2023-08-20 DIAGNOSIS — I152 Hypertension secondary to endocrine disorders: Secondary | ICD-10-CM | POA: Diagnosis not present

## 2023-08-20 DIAGNOSIS — E118 Type 2 diabetes mellitus with unspecified complications: Secondary | ICD-10-CM

## 2023-08-20 DIAGNOSIS — Z7189 Other specified counseling: Secondary | ICD-10-CM

## 2023-08-20 DIAGNOSIS — H2513 Age-related nuclear cataract, bilateral: Secondary | ICD-10-CM

## 2023-08-20 DIAGNOSIS — E1159 Type 2 diabetes mellitus with other circulatory complications: Secondary | ICD-10-CM

## 2023-08-20 DIAGNOSIS — E785 Hyperlipidemia, unspecified: Secondary | ICD-10-CM

## 2023-08-20 DIAGNOSIS — E1136 Type 2 diabetes mellitus with diabetic cataract: Secondary | ICD-10-CM

## 2023-08-20 LAB — POCT GLYCOSYLATED HEMOGLOBIN (HGB A1C): Hemoglobin A1C: 7.6 % — AB (ref 4.0–5.6)

## 2023-08-20 NOTE — Progress Notes (Unsigned)
   Subjective:    Patient ID: Marcia Paul, female    DOB: 09-19-1945, 78 y.o.   MRN: [id number]  HPI She is here for follow-up on her diabetes.  She does have a CGM in place and this was reviewed with her.  She is now taking 36 units of Soliqua .  She is also taking metformin .  She apparently did go to 38 at 1 time but then did note at least one of her blood sugars being too low she continues on Benicar .  She does complain of intermittent foot pain which is keeps her from exercising.  She does use 2 Advil per day but is reluctant to go higher.  She still is grieving over the loss of her husband from several years ago.   Review of Systems     Objective:    Physical Exam Alert and in no distress. Tympanic membranes and canals are normal. Pharyngeal area is normal. Neck is supple without adenopathy or thyromegaly. Cardiac exam shows a regular sinus rhythm without murmurs or gallops. Lungs are clear to auscultation. Foot exam is normal.  Doppler was used for her pulses.  The CGM report was reviewed with her.  There have been some technical issues with the CGM but this seems to have cleared itself up. Hemoglobin A1c is 7.6 I did review the CGM with her in detail.      Assessment & Plan:  Type 2 diabetes mellitus with complication, with long-term current use of insulin  (HCC) - Plan: CBC with Differential/Platelet, Comprehensive metabolic panel with GFR, Lipid panel, POCT glycosylated hemoglobin (Hb A1C), POCT UA - Microalbumin  Nuclear sclerotic cataract of both eyes  Morbid obesity, unspecified obesity type (HCC)  Hypertension associated with diabetes (HCC) - Plan: CBC with Differential/Platelet, Comprehensive metabolic panel with GFR  Hyperlipidemia associated with type 2 diabetes mellitus (HCC) - Plan: Lipid panel  Diabetic cataract, associated with type 2 diabetes mellitus (HCC)  Bereavement counseling I will have her go to 38 units to see if we can get her blood sugars down a  little bit more.  She will need to be vigilant in case it does drop.  She only had a drop down 1 time when she did use 38 units. Recommend more aggressive use of Advil for her foot pain as she uses that as a reason for not being as physically active.  Suggested using as high as 800 mg 3 times daily. Then discussed bereavement with her.  Encouraged her to keep moving forward.  Explained that she will never forget her husband but does not need to let that interfere with her enjoyed her present life. Recommend flu and COVID this winter.

## 2023-08-21 LAB — LIPID PANEL
Chol/HDL Ratio: 2.8 ratio (ref 0.0–4.4)
Cholesterol, Total: 199 mg/dL (ref 100–199)
HDL: 70 mg/dL (ref >39)
LDL Chol Calc (NIH): 106 mg/dL — ABNORMAL HIGH (ref 0–99)
Triglycerides: 131 mg/dL (ref 0–149)
VLDL Cholesterol Cal: 23 mg/dL (ref 5–40)

## 2023-08-21 LAB — CBC WITH DIFFERENTIAL/PLATELET
Basophils Absolute: 0 10*3/uL (ref 0.0–0.2)
Basos: 1 %
EOS (ABSOLUTE): 0.2 10*3/uL (ref 0.0–0.4)
Eos: 2 %
Hematocrit: 41.6 % (ref 34.0–46.6)
Hemoglobin: 13.2 g/dL (ref 11.1–15.9)
Immature Grans (Abs): 0 10*3/uL (ref 0.0–0.1)
Immature Granulocytes: 0 %
Lymphocytes Absolute: 1.8 10*3/uL (ref 0.7–3.1)
Lymphs: 22 %
MCH: 28.8 pg (ref 26.6–33.0)
MCHC: 31.7 g/dL (ref 31.5–35.7)
MCV: 91 fL (ref 79–97)
Monocytes Absolute: 0.5 10*3/uL (ref 0.1–0.9)
Monocytes: 6 %
Neutrophils Absolute: 5.8 10*3/uL (ref 1.4–7.0)
Neutrophils: 69 %
Platelets: 298 10*3/uL (ref 150–450)
RBC: 4.59 x10E6/uL (ref 3.77–5.28)
RDW: 13.2 % (ref 11.7–15.4)
WBC: 8.3 10*3/uL (ref 3.4–10.8)

## 2023-08-21 LAB — COMPREHENSIVE METABOLIC PANEL WITH GFR
ALT: 17 IU/L (ref 0–32)
AST: 36 IU/L (ref 0–40)
Albumin: 4.6 g/dL (ref 3.8–4.8)
Alkaline Phosphatase: 120 IU/L (ref 44–121)
BUN/Creatinine Ratio: 19 (ref 12–28)
BUN: 19 mg/dL (ref 8–27)
Bilirubin Total: 0.4 mg/dL (ref 0.0–1.2)
CO2: 23 mmol/L (ref 20–29)
Calcium: 10.5 mg/dL — ABNORMAL HIGH (ref 8.7–10.3)
Chloride: 99 mmol/L (ref 96–106)
Creatinine, Ser: 0.99 mg/dL (ref 0.57–1.00)
Globulin, Total: 2.4 g/dL (ref 1.5–4.5)
Glucose: 140 mg/dL — ABNORMAL HIGH (ref 70–99)
Potassium: 4.4 mmol/L (ref 3.5–5.2)
Sodium: 139 mmol/L (ref 134–144)
Total Protein: 7 g/dL (ref 6.0–8.5)
eGFR: 59 mL/min/{1.73_m2} — ABNORMAL LOW (ref >59)

## 2023-08-26 ENCOUNTER — Other Ambulatory Visit (HOSPITAL_COMMUNITY): Payer: Self-pay

## 2023-08-27 ENCOUNTER — Other Ambulatory Visit (HOSPITAL_COMMUNITY): Payer: Self-pay

## 2023-09-10 DIAGNOSIS — H52203 Unspecified astigmatism, bilateral: Secondary | ICD-10-CM | POA: Diagnosis not present

## 2023-09-10 DIAGNOSIS — E119 Type 2 diabetes mellitus without complications: Secondary | ICD-10-CM | POA: Diagnosis not present

## 2023-09-10 DIAGNOSIS — H25813 Combined forms of age-related cataract, bilateral: Secondary | ICD-10-CM | POA: Diagnosis not present

## 2023-09-10 DIAGNOSIS — H5203 Hypermetropia, bilateral: Secondary | ICD-10-CM | POA: Diagnosis not present

## 2023-09-25 ENCOUNTER — Telehealth: Payer: Self-pay

## 2023-09-25 NOTE — Telephone Encounter (Signed)
 Copied from CRM [id number]. Topic: General - Other >> Sep 24, 2023  4:17 PM Fonda T wrote: Reason for CRM: Juliene, with [facility], calling on behalf of patient, regarding medication drug therapy alert, regarding statin therapy.  Requesting a phone call to discuss/review. Can be reached at Ph.# [phone]. Will also fax information again, which was previously faxed information on 09/11/23, and 09/20/23, but did not receive a response.

## 2023-09-28 NOTE — Telephone Encounter (Signed)
 The patient called back returning a call to [name]. She will be available the rest of the day. Please call her back to assist further.

## 2023-10-09 ENCOUNTER — Emergency Department (HOSPITAL_BASED_OUTPATIENT_CLINIC_OR_DEPARTMENT_OTHER)

## 2023-10-09 ENCOUNTER — Encounter (HOSPITAL_BASED_OUTPATIENT_CLINIC_OR_DEPARTMENT_OTHER): Payer: Self-pay

## 2023-10-09 ENCOUNTER — Other Ambulatory Visit: Payer: Self-pay

## 2023-10-09 ENCOUNTER — Emergency Department (HOSPITAL_BASED_OUTPATIENT_CLINIC_OR_DEPARTMENT_OTHER)
Admission: EM | Admit: 2023-10-09 | Discharge: 2023-10-09 | Disposition: A | Discharge status: Home / Self Care | Attending: Emergency Medicine | Admitting: Emergency Medicine

## 2023-10-09 ENCOUNTER — Other Ambulatory Visit (HOSPITAL_BASED_OUTPATIENT_CLINIC_OR_DEPARTMENT_OTHER): Payer: Self-pay

## 2023-10-09 DIAGNOSIS — M7731 Calcaneal spur, right foot: Secondary | ICD-10-CM | POA: Diagnosis not present

## 2023-10-09 DIAGNOSIS — W208XXA Other cause of strike by thrown, projected or falling object, initial encounter: Secondary | ICD-10-CM | POA: Diagnosis not present

## 2023-10-09 DIAGNOSIS — Z7984 Long term (current) use of oral hypoglycemic drugs: Secondary | ICD-10-CM | POA: Diagnosis not present

## 2023-10-09 DIAGNOSIS — S97101A Crushing injury of unspecified right toe(s), initial encounter: Secondary | ICD-10-CM

## 2023-10-09 DIAGNOSIS — M7989 Other specified soft tissue disorders: Secondary | ICD-10-CM | POA: Diagnosis not present

## 2023-10-09 DIAGNOSIS — S99921A Unspecified injury of right foot, initial encounter: Secondary | ICD-10-CM | POA: Diagnosis not present

## 2023-10-09 DIAGNOSIS — S90111A Contusion of right great toe without damage to nail, initial encounter: Secondary | ICD-10-CM | POA: Diagnosis not present

## 2023-10-09 DIAGNOSIS — Z794 Long term (current) use of insulin: Secondary | ICD-10-CM | POA: Insufficient documentation

## 2023-10-09 DIAGNOSIS — E119 Type 2 diabetes mellitus without complications: Secondary | ICD-10-CM | POA: Diagnosis not present

## 2023-10-09 DIAGNOSIS — Z7982 Long term (current) use of aspirin: Secondary | ICD-10-CM | POA: Diagnosis not present

## 2023-10-09 DIAGNOSIS — M79671 Pain in right foot: Secondary | ICD-10-CM | POA: Diagnosis present

## 2023-10-09 MED ORDER — OXYCODONE-ACETAMINOPHEN 5-325 MG PO TABS
1.0000 | ORAL_TABLET | Freq: Four times a day (QID) | ORAL | 0 refills | Status: DC | PRN
  Filled 2023-10-09: qty 8, 2d supply, fill #0

## 2023-10-09 MED ORDER — HYDROCODONE-ACETAMINOPHEN 5-325 MG PO TABS
1.0000 | ORAL_TABLET | Freq: Once | ORAL | Status: AC
  Administered 2023-10-09: 1 via ORAL
  Filled 2023-10-09: qty 1

## 2023-10-09 NOTE — Discharge Instructions (Addendum)
 It was a pleasure taking care of you here today.  Would recommend ibuprofen, ice and elevate the foot.  I have written you a short course of pain medicine (percocet) for breakthrough pain that is unrelieved with ibuprofen.  Please use caution as medication may make you sleepy or lightheaded.  It is an opiate prescription does have addictive potential.  Make sure to follow-up with primary care provider or orthopedics if your symptoms do not improve over the next week

## 2023-10-09 NOTE — ED Provider Notes (Signed)
 [facility] AT [facility] Provider Note   CSN: [id number] Arrival date & time: 10/09/23  1455    Patient presents with: Foot Pain   Marcia Paul is a 78 y.o. female here for evaluation of right foot pain.  Excellently dropped a cast iron to up to a pan on right great toe.  Was wearing house shoes.  Has noted some bruising and swelling.  Pain worse with ambulating to the toe.  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  No lacerations.  Has cane   HPI     Prior to Admission medications   Medication Sig Start Date End Date Taking? Authorizing Provider  oxyCODONE -acetaminophen  (PERCOCET/ROXICET) 5-325 MG tablet Take 1 tablet by mouth every 6 (six) hours as needed for severe pain (pain score 7-10). 10/09/23  Yes Leeona Mccardle A, PA-C  ACCU-CHEK AVIVA PLUS test strip 1 EACH BY IN VITRO ROUTE IN THE MORNING, AT NOON, AND AT BEDTIME. 03/12/22   Joyce Norleen BROCKS, MD  Accu-Chek Softclix Lancets lancets Use as directed in the morning, at noon, and at bedtime. Use as instructed 04/13/23   Lalonde, John C, MD  aspirin 81 MG tablet Take 81 mg by mouth at bedtime.    [provider]  Blood Glucose Monitoring Suppl (ACCU-CHEK AVIVA PLUS) w/Device KIT 1 m by Does not apply route 3 (three) times daily. 03/17/22   Lalonde, John C, MD  Cholecalciferol (VITAMIN D) 2000 UNITS CAPS Take 1 capsule by mouth every morning.    [provider]  Continuous Glucose Sensor (FREESTYLE LIBRE 2 PLUS SENSOR) MISC Place sensor on arm every 14 days. 07/16/23   Joyce Norleen BROCKS, MD  Insulin  Glargine-Lixisenatide  (SOLIQUA ) 100-33 UNT-MCG/ML SOPN Inject 36 Units into the skin daily. 05/19/23   Joyce Norleen BROCKS, MD  Insulin  Pen Needle (BD PEN NEEDLE NANO 2ND GEN) 32G X 4 MM MISC 3 Sticks by Does not apply route 3 (three) times daily. 03/12/22   Joyce Norleen BROCKS, MD  metFORMIN  (GLUCOPHAGE ) 1000 MG tablet Take 1 tablet (1,000 mg total) by mouth 2 (two) times daily with a meal.  02/12/23   Joyce Norleen BROCKS, MD  Multiple Vitamin (MULTIVITAMIN) capsule Take 1 capsule by mouth every morning.    [provider]  Nettle, Urtica Dioica, 435 MG CAPS Take 1 capsule by mouth 2 (two) times daily.     [provider]  olmesartan -hydrochlorothiazide  (BENICAR  HCT) 40-25 MG tablet Take 1 tablet by mouth daily. 02/12/23   Joyce Norleen BROCKS, MD  Omega-3 Fatty Acids (FISH OIL) 1200 MG CAPS Take 1 capsule by mouth 2 (two) times daily.    [provider]  polyethylene glycol (MIRALAX  / GLYCOLAX ) packet TAKE 1 PACKET IN 8OZ OF WATER 2 TIMES DAILY 04/06/17   Joyce Norleen BROCKS, MD  omega-3 acid ethyl esters (LOVAZA) 1 G capsule Take 2 g by mouth daily.    11/14/19  [provider]    Allergies: Darvon and Statins    Review of Systems  Constitutional: Negative.   HENT: Negative.    Respiratory: Negative.    Cardiovascular: Negative.   Gastrointestinal: Negative.   Musculoskeletal:        Right great toe pain  All other systems reviewed and are negative.   Updated Vital Signs BP (!) 157/92 (BP Location: Right Arm)   Pulse 88   Temp 97.9 F (36.6 C) (Oral)   Resp 17   Ht 5' 10 (1.778 m)   [place] ROLLEN)  181.4 kg   SpO2 92%   BMI 57.39 kg/m   Physical Exam Vitals and nursing note reviewed.  Constitutional:      General: She is not in acute distress.    Appearance: She is well-developed. She is not ill-appearing.  HENT:     Head: Atraumatic.  Eyes:     Pupils: Pupils are equal, round, and reactive to light.  Cardiovascular:     Rate and Rhythm: Normal rate.     Pulses:          Dorsalis pedis pulses are 2+ on the right side.       Posterior tibial pulses are 2+ on the right side.  Pulmonary:     Effort: No respiratory distress.  Abdominal:     General: There is no distension.  Musculoskeletal:        General: Normal range of motion.     Cervical back: Normal range of motion.     Comments: Tenderness left great toe, overlying edema,  ecchymosis.  Compartments soft.  Full range of motion.  No nailbed injury.  Nontender proximal foot, tib-fib  Skin:    General: Skin is warm and dry.     Comments: No lacerations, nailbed injury  Neurological:     General: No focal deficit present.     Mental Status: She is alert.     Motor: Motor function is intact.     Gait: Gait is intact.     Comments: Cane Chronic neuropathy to BLE, baseline per patient  Psychiatric:        Mood and Affect: Mood normal.     (all labs ordered are listed, but only abnormal results are displayed) Labs Reviewed - No data to display  EKG: None  Radiology: DG Foot Complete Right Result Date: 10/09/2023 CLINICAL DATA:  Bruising/swelling/injury. EXAM: RIGHT FOOT COMPLETE - 3+ VIEW COMPARISON:  None Available. FINDINGS: Negative for acute fracture or dislocation in the right foot. Right foot alignment is within normal limits. Large calcaneal spurs. Ossifications along the plantar fascia. IMPRESSION: 1. No acute bone abnormality in the right foot. 2. Large calcaneal spurs. Electronically Signed   By: Juliene Balder M.D.   On: 10/09/2023 15:47     .Ortho Injury Treatment  Date/Time: 10/09/2023 4:57 PM  Performed by: Edie Einstein A, PA-C Authorized by: Edie Einstein LABOR, PA-C   Consent:    Consent obtained:  Verbal   Consent given by:  Patient   Risks discussed:  Fracture, nerve damage, restricted joint movement, vascular damage, recurrent dislocation, stiffness and irreducible dislocation   Alternatives discussed:  No treatment, alternative treatment, immobilization, referral and delayed treatmentInjury location: toe Location details: right great toe Injury type: soft tissue Pre-procedure neurovascular assessment: neurovascularly intact Pre-procedure distal perfusion: normal Pre-procedure neurological function: normal Pre-procedure range of motion: normal  Patient sedated: NoImmobilization: tape and brace Splint Applied by: ED [employer]  used: post op shoe. Post-procedure neurovascular assessment: post-procedure neurovascularly intact Post-procedure distal perfusion: normal Post-procedure neurological function: normal Post-procedure range of motion: normal      Medications Ordered in the ED  HYDROcodone -acetaminophen  (NORCO/VICODIN) 5-325 MG per tablet 1 tablet (1 tablet Oral Given 10/09/23 6911)   78 year old here with family for right great toe pain after accidentally dropping a cast iron pan landed on her great toe.  Neurovascularly intact.  Some tenderness to the great toe with surrounding ecchymosis, edema however compartments are soft.  Good range of motion.  Nontender proximal foot, tib-fib has chronic neuropathy due  to her diabetes.  Has a cane.  Imaging personally viewed interpreted No fracture, dislocation  Discussed results with patient.  I was going to buddy tape for comfort however patient could not tolerate.  She is placed in a postop shoe.  Discussed RICE for symptomatic management and will have her follow-up outpatient.  Low suspicion for occult fracture, dislocation, compartment syndrome, Lisfranc injury, tib-fib injury, septic joint, gout, hemarthrosis, nailbed injury, VTE, ischemia.  Suspect she likely has soft tissue injury which likely improved with NSAIDs, ice and elevation.  Ambulatory  Will have her follow-up outpatient, return for worsening symptoms  The patient has been appropriately medically screened and/or stabilized in the ED. I have low suspicion for any other emergent medical condition which would require further screening, evaluation or treatment in the ED or require inpatient management.  Patient is hemodynamically stable and in no acute distress.  Patient able to ambulate in department prior to ED.  Evaluation does not show acute pathology that would require ongoing or additional emergent interventions while in the emergency department or further inpatient treatment.  I have discussed the  diagnosis with the patient and answered all questions.  Pain is been managed while in the emergency department and patient has no further complaints prior to discharge.  Patient is comfortable with plan discussed in room and is stable for discharge at this time.  I have discussed strict return precautions for returning to the emergency department.  Patient was encouraged to follow-up with PCP/specialist refer to at discharge.                                     Medical Decision Making Amount and/or Complexity of Data Reviewed Independent Historian:     Details: Family in room External Data Reviewed: labs, radiology and notes. Radiology: ordered and independent interpretation performed. Decision-making details documented in ED Course.  Risk OTC drugs. Prescription drug management. Decision regarding hospitalization. Diagnosis or treatment significantly limited by social determinants of health.       Final diagnoses:  Crushing injury of toe of right foot, initial encounter    ED Discharge Orders          Ordered    oxyCODONE -acetaminophen  (PERCOCET/ROXICET) 5-325 MG tablet  Every 6 hours PRN        10/09/23 1618               Marylee Belzer A, PA-C 10/09/23 1658    Dasie Faden, MD 10/12/23 [phone]

## 2023-10-09 NOTE — ED Triage Notes (Signed)
 Pt POV from home c/o pain in top of right foot/great toe pain. Pt accidentally dropped a cast iron pan on her foot, wearing house slippers. Bruising and swelling noted to great toe.

## 2023-10-09 NOTE — ED Notes (Signed)
 Ice pack applied to R foot, pt states she prefers to remain in wheelchair.

## 2023-10-10 DIAGNOSIS — S97111A Crushing injury of right great toe, initial encounter: Secondary | ICD-10-CM | POA: Diagnosis not present

## 2023-10-15 ENCOUNTER — Other Ambulatory Visit: Payer: Self-pay | Admitting: Family Medicine

## 2023-10-15 ENCOUNTER — Other Ambulatory Visit: Payer: Self-pay

## 2023-10-15 ENCOUNTER — Other Ambulatory Visit (HOSPITAL_BASED_OUTPATIENT_CLINIC_OR_DEPARTMENT_OTHER): Payer: Self-pay

## 2023-10-15 ENCOUNTER — Other Ambulatory Visit (HOSPITAL_COMMUNITY): Payer: Self-pay

## 2023-10-15 MED ORDER — SOLIQUA 100-33 UNT-MCG/ML ~~LOC~~ SOPN
36.0000 [IU] | PEN_INJECTOR | Freq: Every day | SUBCUTANEOUS | 1 refills | Status: DC
  Filled 2023-10-15: qty 36, 100d supply, fill #0
  Filled 2023-11-18: qty 30, 84d supply, fill #0

## 2023-10-16 ENCOUNTER — Other Ambulatory Visit (HOSPITAL_COMMUNITY): Payer: Self-pay

## 2023-11-18 ENCOUNTER — Other Ambulatory Visit: Payer: Self-pay

## 2023-11-18 ENCOUNTER — Other Ambulatory Visit (HOSPITAL_COMMUNITY): Payer: Self-pay

## 2023-11-20 ENCOUNTER — Other Ambulatory Visit (HOSPITAL_COMMUNITY): Payer: Self-pay

## 2023-11-23 ENCOUNTER — Other Ambulatory Visit: Payer: Self-pay

## 2023-11-25 ENCOUNTER — Other Ambulatory Visit (HOSPITAL_COMMUNITY): Payer: Self-pay

## 2023-11-25 ENCOUNTER — Other Ambulatory Visit: Payer: Self-pay

## 2023-11-25 DIAGNOSIS — Z794 Long term (current) use of insulin: Secondary | ICD-10-CM

## 2023-11-25 MED ORDER — SOLIQUA 100-33 UNT-MCG/ML ~~LOC~~ SOPN
36.0000 [IU] | PEN_INJECTOR | Freq: Every day | SUBCUTANEOUS | 1 refills | Status: DC
  Filled 2023-11-25: qty 36, 100d supply, fill #0

## 2023-11-26 ENCOUNTER — Other Ambulatory Visit: Payer: Self-pay

## 2023-11-26 ENCOUNTER — Other Ambulatory Visit (HOSPITAL_COMMUNITY): Payer: Self-pay

## 2023-11-26 ENCOUNTER — Other Ambulatory Visit: Payer: Self-pay | Admitting: Family Medicine

## 2023-11-26 MED ORDER — BD PEN NEEDLE NANO 2ND GEN 32G X 4 MM MISC
3.0000 | Freq: Three times a day (TID) | 1 refills | Status: AC
  Filled 2023-11-26 – 2023-11-27 (×3): qty 300, 90d supply, fill #0

## 2023-11-27 ENCOUNTER — Other Ambulatory Visit (HOSPITAL_COMMUNITY): Payer: Self-pay

## 2023-11-27 ENCOUNTER — Other Ambulatory Visit: Payer: Self-pay | Admitting: Family Medicine

## 2023-11-27 ENCOUNTER — Other Ambulatory Visit: Payer: Self-pay

## 2023-11-27 DIAGNOSIS — E118 Type 2 diabetes mellitus with unspecified complications: Secondary | ICD-10-CM

## 2024-02-01 ENCOUNTER — Other Ambulatory Visit (HOSPITAL_COMMUNITY): Payer: Self-pay

## 2024-02-01 ENCOUNTER — Other Ambulatory Visit: Payer: Self-pay

## 2024-02-01 ENCOUNTER — Other Ambulatory Visit: Payer: Self-pay | Admitting: Family Medicine

## 2024-02-01 ENCOUNTER — Telehealth: Payer: Self-pay

## 2024-02-01 DIAGNOSIS — E118 Type 2 diabetes mellitus with unspecified complications: Secondary | ICD-10-CM

## 2024-02-01 MED ORDER — SOLIQUA 100-33 UNT-MCG/ML ~~LOC~~ SOPN
38.0000 [IU] | PEN_INJECTOR | Freq: Every day | SUBCUTANEOUS | 1 refills | Status: DC
  Filled 2024-02-01: qty 30, 78d supply, fill #0

## 2024-02-01 MED ORDER — SOLIQUA 100-33 UNT-MCG/ML ~~LOC~~ SOPN
38.0000 [IU] | PEN_INJECTOR | Freq: Every day | SUBCUTANEOUS | 1 refills | Status: AC
  Filled 2024-02-01 – 2024-02-02 (×2): qty 30, 78d supply, fill #0

## 2024-02-01 NOTE — Telephone Encounter (Signed)
 Called patient and LVM, Per Dr. Colonel notes this was changed to 38 units, and this was sent in to the pharmacy as 38 units. There have been several attempts to change this with pharmacy.    Copied from CRM #[id number]. Topic: Clinical - Prescription Issue >> Feb 01, 2024  1:00 PM Marcia Paul wrote: Reason for CRM: pt is calling in regards to her medication Pending Insulin  Glargine-Lixisenatide  36 Units Subcutaneous Daily. Pt states back in June this medication was to go to 38 units not 36 pt states its still 67 and has been asking since June that this e corrected and has not heard anything from the office nor has the pharmacy. Pt would like a call back today as it 6 months with no call back and no medication correction.

## 2024-02-02 ENCOUNTER — Ambulatory Visit: Payer: Medicare PPO

## 2024-02-02 ENCOUNTER — Other Ambulatory Visit (HOSPITAL_COMMUNITY): Payer: Self-pay

## 2024-02-02 VITALS — Ht 69.0 in | Wt 397.0 lb

## 2024-02-02 DIAGNOSIS — Z0000 Encounter for general adult medical examination without abnormal findings: Secondary | ICD-10-CM | POA: Diagnosis not present

## 2024-02-02 NOTE — Telephone Encounter (Signed)
 Has an appt on 02/18/24

## 2024-02-02 NOTE — Patient Instructions (Addendum)
 [name],  Thank you for taking the time for your Medicare Wellness Visit. I appreciate your continued commitment to your health goals. Please review the care plan we discussed, and feel free to reach out if I can assist you further.  Please note that Annual Wellness Visits do not include a physical exam. Some assessments may be limited, especially if the visit was conducted virtually. If needed, we may recommend an in-person follow-up with your provider.  Ongoing Care Seeing your primary care provider every 3 to 6 months helps us  monitor your health and provide consistent, personalized care.   Referrals If a referral was made during today's visit and you haven't received any updates within two weeks, please contact the referred provider directly to check on the status.  Recommended Screenings:  Health Maintenance  Topic Date Due   Yearly kidney health urinalysis for diabetes  11/08/2016   Eye exam for diabetics  07/03/2023   Flu Shot  10/02/2023   COVID-19 Vaccine (7 - 2025-26 season) 11/02/2023   Pneumococcal Vaccine for age over 16 (3 of 3 - PCV20 or PCV21) 06/29/2028*   Hemoglobin A1C  02/19/2024   Yearly kidney function blood test for diabetes  08/19/2024   Complete foot exam   08/19/2024   Medicare Annual Wellness Visit  02/01/2025   DTaP/Tdap/Td vaccine (2 - Td or Tdap) 04/30/2026   Osteoporosis screening with Bone Density Scan  Completed   Hepatitis C Screening  Completed   Zoster (Shingles) Vaccine  Completed   Meningitis B Vaccine  Aged Out   Breast Cancer Screening  Discontinued   Colon Cancer Screening  Discontinued  *Topic was postponed. The date shown is not the original due date.       01/26/2024    6:26 PM  Advanced Directives  Does Patient Have a Medical Advance Directive? Yes  Type of Estate Agent of [place];Living will  Copy of Healthcare Power of Attorney in Chart? Yes - validated most recent copy scanned in chart (See row  information)    Vision: Annual vision screenings are recommended for early detection of glaucoma, cataracts, and diabetic retinopathy. These exams can also reveal signs of chronic conditions such as diabetes and high blood pressure.  Dental: Annual dental screenings help detect early signs of oral cancer, gum disease, and other conditions linked to overall health, including heart disease and diabetes.  Please see the attached documents for additional preventive care recommendations.
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
## 2024-02-11 ENCOUNTER — Other Ambulatory Visit (HOSPITAL_COMMUNITY): Payer: Self-pay

## 2024-02-15 ENCOUNTER — Other Ambulatory Visit: Payer: Self-pay | Admitting: Family Medicine

## 2024-02-15 ENCOUNTER — Other Ambulatory Visit: Payer: Self-pay

## 2024-02-15 ENCOUNTER — Other Ambulatory Visit (HOSPITAL_COMMUNITY): Payer: Self-pay

## 2024-02-15 DIAGNOSIS — I152 Hypertension secondary to endocrine disorders: Secondary | ICD-10-CM

## 2024-02-15 MED ORDER — OLMESARTAN MEDOXOMIL-HCTZ 40-25 MG PO TABS
1.0000 | ORAL_TABLET | Freq: Every day | ORAL | 3 refills | Status: AC
  Filled 2024-02-15: qty 90, 90d supply, fill #0

## 2024-02-15 MED ORDER — FREESTYLE LIBRE 2 PLUS SENSOR MISC
1.0000 | 1 refills | Status: AC
  Filled 2024-02-15: qty 6, 84d supply, fill #0

## 2024-02-15 MED ORDER — METFORMIN HCL 1000 MG PO TABS
1000.0000 mg | ORAL_TABLET | Freq: Two times a day (BID) | ORAL | 3 refills | Status: AC
  Filled 2024-02-15: qty 180, 90d supply, fill #0

## 2024-02-16 ENCOUNTER — Other Ambulatory Visit (HOSPITAL_COMMUNITY): Payer: Self-pay

## 2024-02-18 ENCOUNTER — Ambulatory Visit: Payer: Self-pay | Admitting: Family Medicine

## 2024-02-18 NOTE — Progress Notes (Signed)
 CLARRISA KAYLOR                                          MRN: [id number]   02/18/2024   The [facility] Quality Team Specialist reviewed this patient medical record for the purposes of chart review for care gap closure. The following were reviewed: chart review for care gap closure-kidney health evaluation for diabetes:uACR.    [facility] Quality Team

## 2024-03-14 ENCOUNTER — Telehealth: Payer: Self-pay | Admitting: Family Medicine

## 2024-03-14 NOTE — Telephone Encounter (Signed)
 Is this ok?  Copied from CRM #[id number]. Topic: Appointments - Scheduling Inquiry for Clinic >> Mar 14, 2024 11:46 AM [name] wrote: Reason for CRM: Pt requested for hr appt on Jan 15th to be Virtual.

## 2024-03-17 ENCOUNTER — Encounter: Payer: Self-pay | Admitting: Family Medicine

## 2024-03-17 ENCOUNTER — Telehealth (INDEPENDENT_AMBULATORY_CARE_PROVIDER_SITE_OTHER): Admitting: Family Medicine

## 2024-03-17 VITALS — HR 65 | Temp 97.8°F | Wt 398.4 lb

## 2024-03-17 DIAGNOSIS — G720 Drug-induced myopathy: Secondary | ICD-10-CM | POA: Diagnosis not present

## 2024-03-17 DIAGNOSIS — E1159 Type 2 diabetes mellitus with other circulatory complications: Secondary | ICD-10-CM

## 2024-03-17 DIAGNOSIS — E785 Hyperlipidemia, unspecified: Secondary | ICD-10-CM

## 2024-03-17 DIAGNOSIS — I152 Hypertension secondary to endocrine disorders: Secondary | ICD-10-CM

## 2024-03-17 DIAGNOSIS — E1169 Type 2 diabetes mellitus with other specified complication: Secondary | ICD-10-CM

## 2024-03-17 DIAGNOSIS — T466X5A Adverse effect of antihyperlipidemic and antiarteriosclerotic drugs, initial encounter: Secondary | ICD-10-CM

## 2024-03-17 NOTE — Progress Notes (Signed)
" ° °  Subjective:    Patient ID: Marcia Paul, female    DOB: 1945-12-22, 79 y.o.   MRN: [id number]  HPI Documentation for virtual audio and video telecommunications through Caregility encounter:  The patient was located at home. 2 patient identifiers used.  The provider was located in the office. The patient did consent to this visit and is aware of possible charges through their insurance for this visit. The other persons participating in this telemedicine service were none. Time spent on call was 5 minutes and in review of previous records >25 minutes total for counseling and coordination of care. This virtual service is not related to other E/M service within previous 7 days.  She is here for diabetes check.  Her AGP report was reviewed.  She keeps her blood sugars in the very good range being in target 90% of the time.  Her GMI as per this recording is 6.7 which is probably pretty accurate for her.  She continues on 38 units of Soliqua  but has lost only 17 pounds in the last year.  She continues on metformin  as well as Benicar   Review of Systems     Objective:    Physical Exam  Alert and in no Sotylize not examined.  Visually she did look quite nice.  Her GMI is 6.7.      Assessment & Plan:  Hyperlipidemia associated with type 2 diabetes mellitus (HCC)  Hypertension associated with diabetes (HCC)  Morbid obesity, unspecified obesity type (HCC)  Statin myopathy I have asked her to check with her insurance to see what the cost would be for her to switch to Mounjaro and back to Lantus  explaining that I would like to see more weight loss.  She will check with them and let me know if she thinks she can then afford these 2 particular medications.  Recheck here in 6 months. "

## 2024-09-22 ENCOUNTER — Ambulatory Visit: Admitting: Family Medicine
